# Patient Record
Sex: Male | Born: 1935 | Race: Black or African American | Hispanic: No | Marital: Married | Smoking: Former smoker
Health system: Southern US, Community
[De-identification: ages and names within clinical notes are randomized; demographics above are authoritative.]

## PROBLEM LIST (undated history)

## (undated) DIAGNOSIS — N183 Chronic kidney disease, stage 3 unspecified: Secondary | ICD-10-CM

## (undated) DIAGNOSIS — E119 Type 2 diabetes mellitus without complications: Secondary | ICD-10-CM

## (undated) DIAGNOSIS — H269 Unspecified cataract: Secondary | ICD-10-CM

## (undated) DIAGNOSIS — Z8673 Personal history of transient ischemic attack (TIA), and cerebral infarction without residual deficits: Secondary | ICD-10-CM

## (undated) DIAGNOSIS — I10 Essential (primary) hypertension: Secondary | ICD-10-CM

## (undated) HISTORY — PX: ORCHIECTOMY: SHX2116

## (undated) HISTORY — PX: SURGERY OF LIP: SUR1315

---

## 2006-10-26 ENCOUNTER — Ambulatory Visit (HOSPITAL_COMMUNITY): Admission: RE | Admit: 2006-10-26 | Discharge: 2006-10-26 | Payer: Self-pay | Admitting: Podiatry

## 2006-10-29 ENCOUNTER — Ambulatory Visit: Payer: Self-pay | Admitting: Family Medicine

## 2006-11-19 ENCOUNTER — Ambulatory Visit: Payer: Self-pay | Admitting: Family Medicine

## 2007-05-28 IMAGING — US US EXTREM LOW VENOUS BILAT
1 series · 14 of 24 positions shown · non-contrast
Comparison: none

CLINICAL DATA: Bilateral lower extremity redness with ulcers.    
 BILATERAL LOWER EXTREMITY VENOUS DOPPLER ULTRASOUND:
TECHNIQUE: Gray-scale sonography with compression, as well as color and duplex Doppler ultrasound, were performed to evaluate the deep venous system from the level of the common femoral vein through the popliteal and proximal calf veins.

[Series 1: unknown · 14 of 53 slices shown]
[im 1/53]
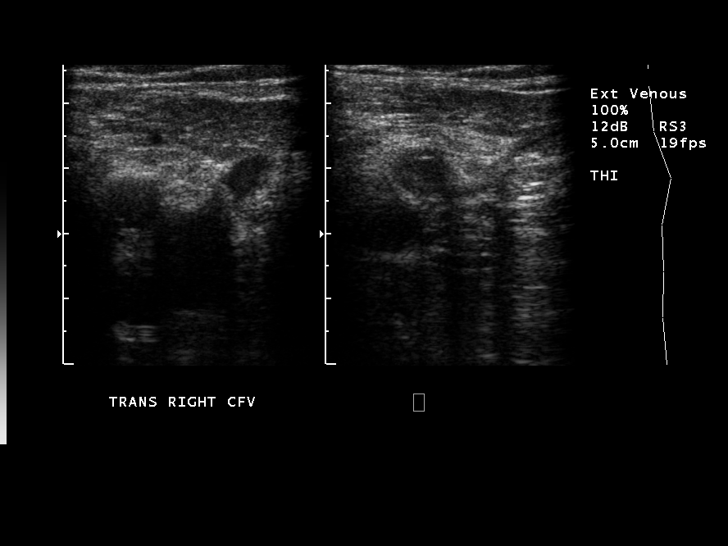
[im 5/53]
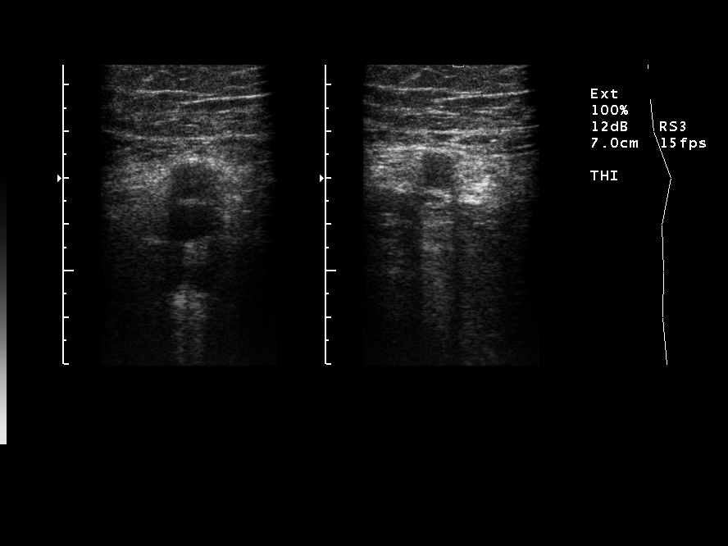
[im 10/53]
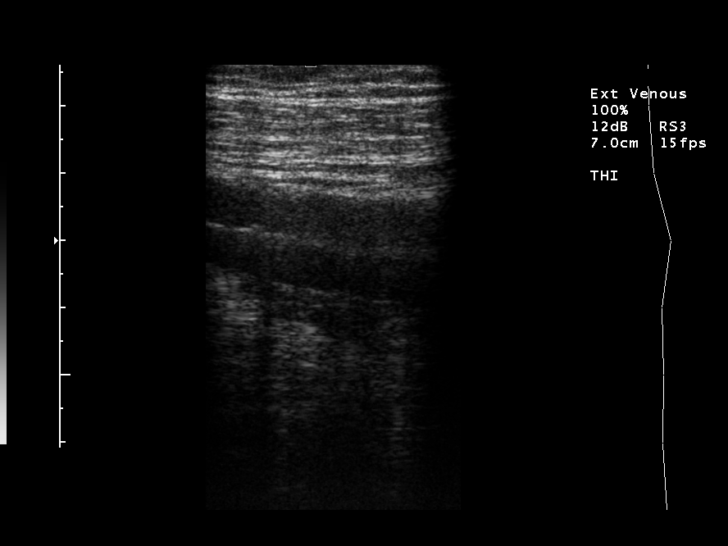
[im 14/53]
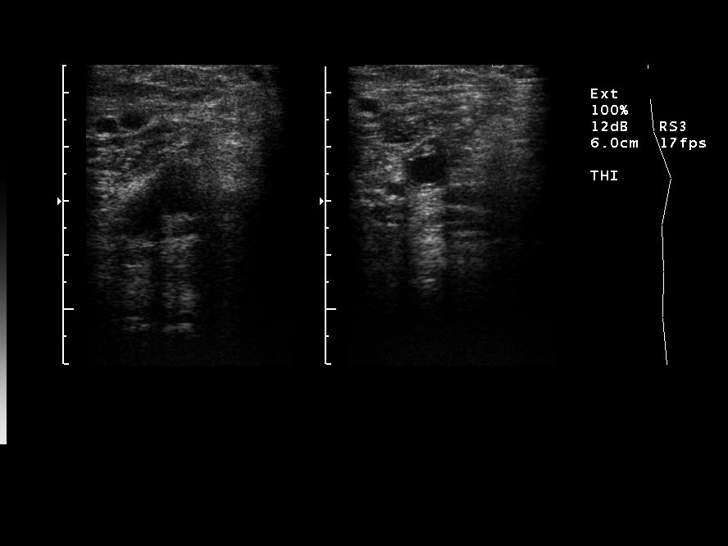
[im 16/53]
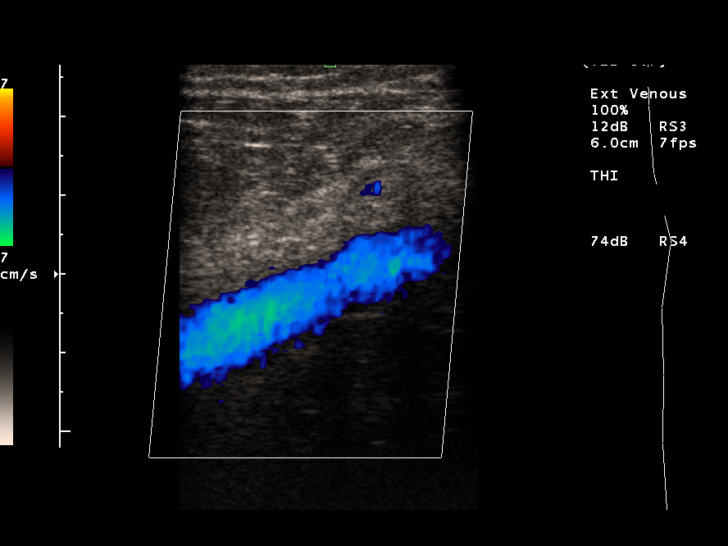
[im 21/53]
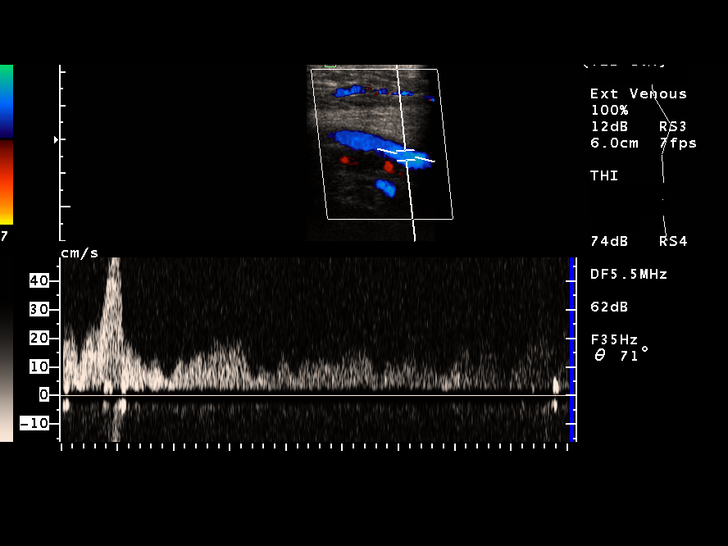
[im 25/53]
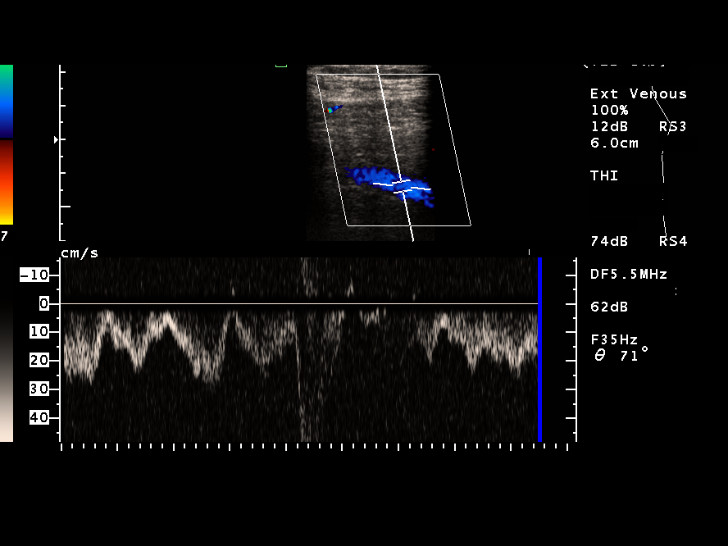
[im 28/53]
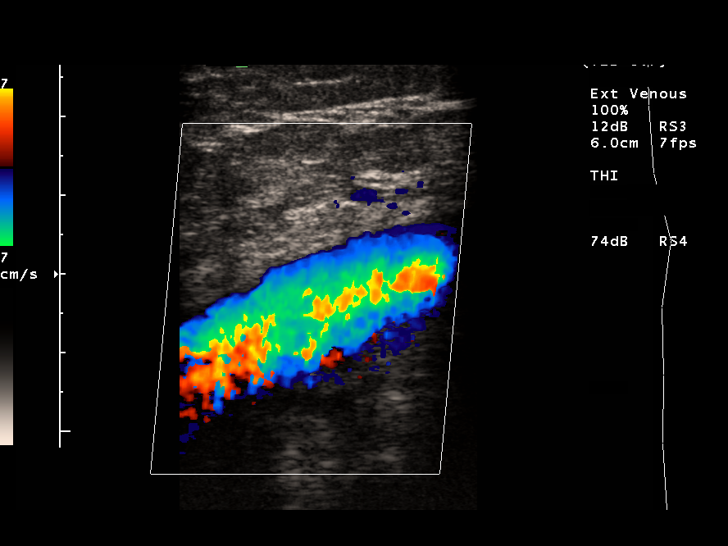
[im 32/53]
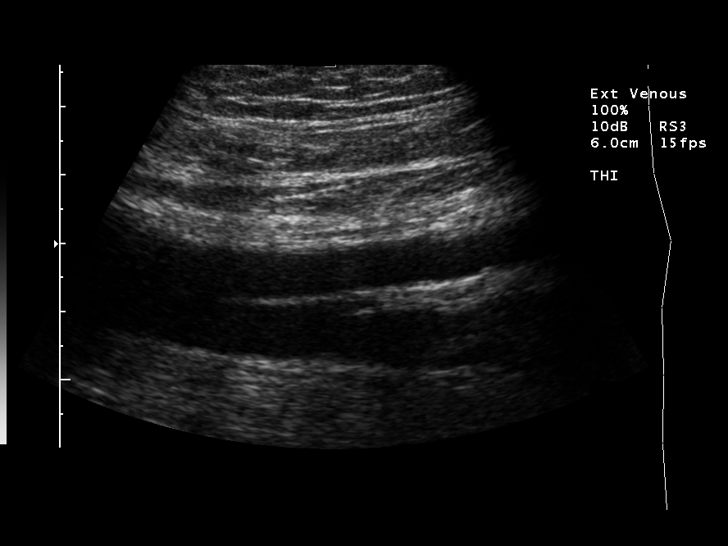
[im 37/53]
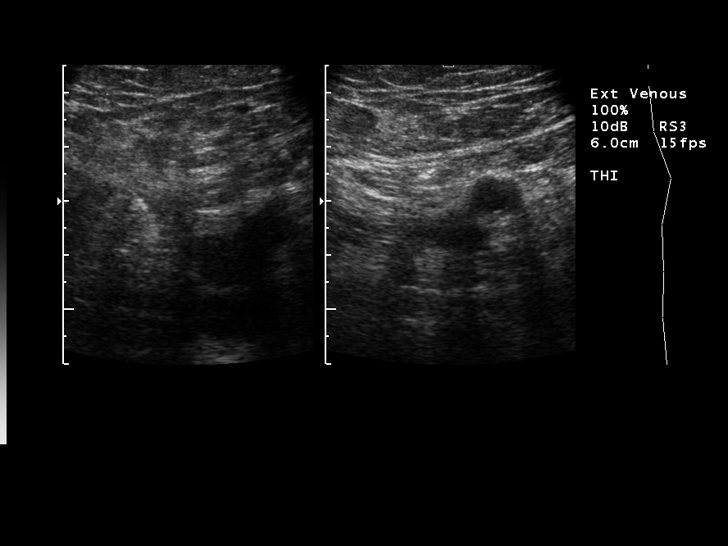
[im 41/53]
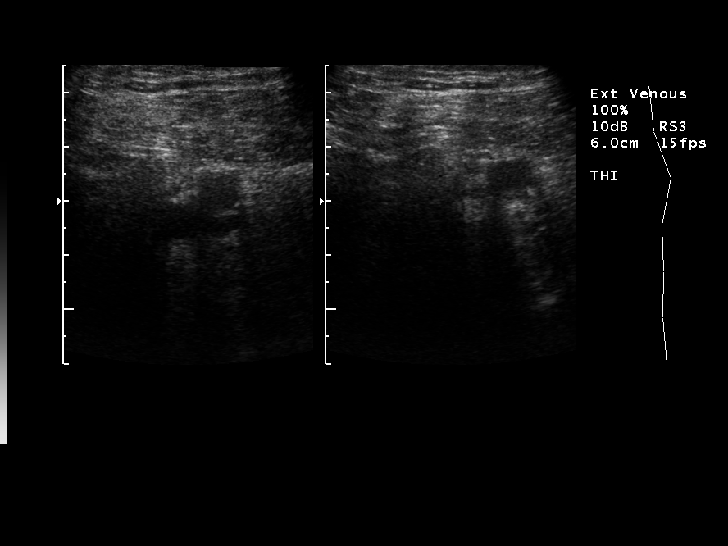
[im 43/53]
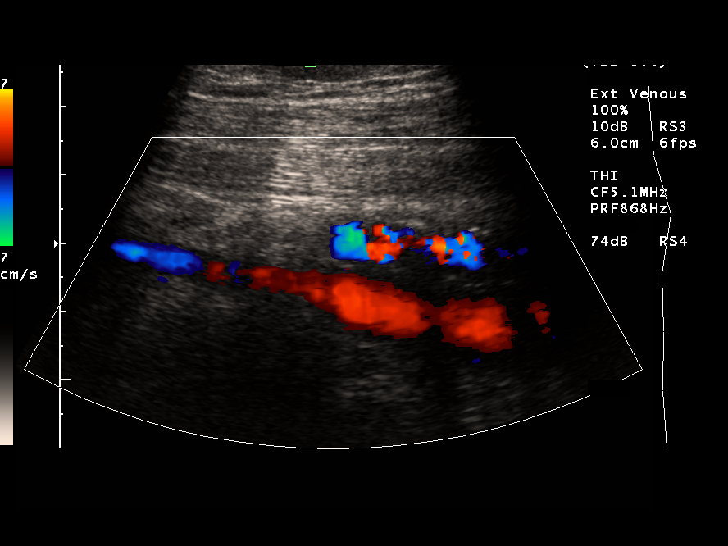
[im 48/53]
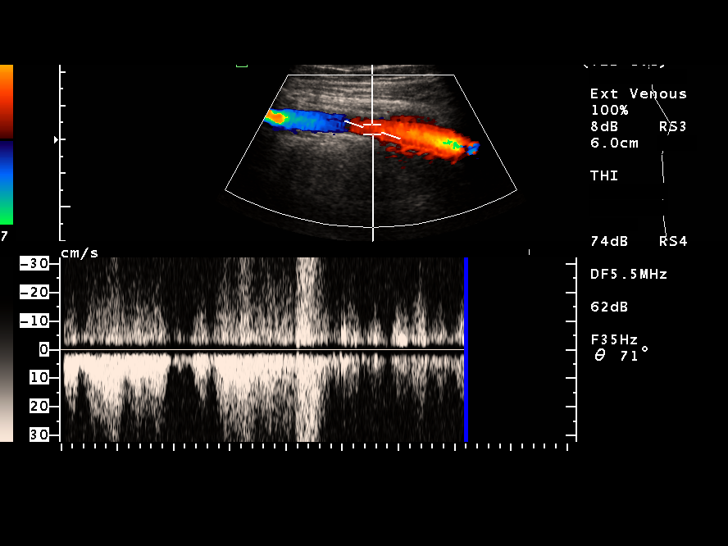
[im 53/53]
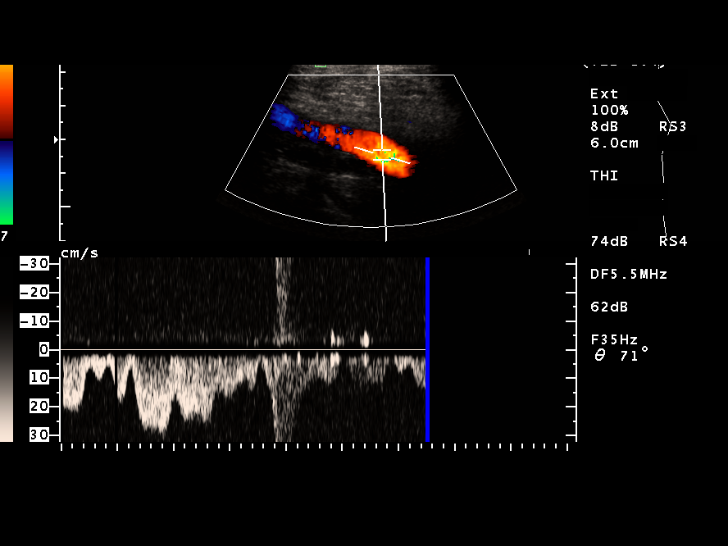

[14 of 24 positions shown; findings below may reference images not displayed]

FINDINGS: No evidence of deep venous thrombosis on either side from the level of the common femoral vein to the upper posterior tibial vein.  Enlarged lymph nodes left groin.  Etiology or significance indeterminate.
IMPRESSION: 1.  No evidence of deep venous thrombosis on either side.  
 2.  Left inguinal adenopathy.  Etiology indeterminate.

## 2009-06-19 DIAGNOSIS — Z8673 Personal history of transient ischemic attack (TIA), and cerebral infarction without residual deficits: Secondary | ICD-10-CM

## 2009-06-19 HISTORY — DX: Personal history of transient ischemic attack (TIA), and cerebral infarction without residual deficits: Z86.73

## 2009-11-17 DEATH — deceased

## 2013-03-19 ENCOUNTER — Ambulatory Visit: Payer: Medicare Other

## 2013-03-19 DIAGNOSIS — Z23 Encounter for immunization: Secondary | ICD-10-CM

## 2013-03-31 ENCOUNTER — Encounter (HOSPITAL_COMMUNITY): Payer: Self-pay | Admitting: Pharmacy Technician

## 2013-04-08 ENCOUNTER — Emergency Department (HOSPITAL_COMMUNITY)
Admission: EM | Admit: 2013-04-08 | Discharge: 2013-04-08 | Disposition: A | Discharge status: Home / Self Care | Payer: Medicare Other | Attending: Emergency Medicine | Admitting: Emergency Medicine

## 2013-04-08 ENCOUNTER — Other Ambulatory Visit: Payer: Self-pay

## 2013-04-08 ENCOUNTER — Encounter (HOSPITAL_COMMUNITY): Payer: Self-pay | Admitting: Emergency Medicine

## 2013-04-08 ENCOUNTER — Encounter (HOSPITAL_COMMUNITY)
Admission: RE | Admit: 2013-04-08 | Discharge: 2013-04-08 | Disposition: A | Discharge status: Home / Self Care | Payer: Medicare Other | Source: Ambulatory Visit | Attending: Ophthalmology | Admitting: Ophthalmology

## 2013-04-08 DIAGNOSIS — I10 Essential (primary) hypertension: Secondary | ICD-10-CM

## 2013-04-08 DIAGNOSIS — Z8669 Personal history of other diseases of the nervous system and sense organs: Secondary | ICD-10-CM | POA: Insufficient documentation

## 2013-04-08 DIAGNOSIS — Z87891 Personal history of nicotine dependence: Secondary | ICD-10-CM | POA: Insufficient documentation

## 2013-04-08 HISTORY — DX: Unspecified cataract: H26.9

## 2013-04-08 MED ORDER — HYDROCHLOROTHIAZIDE 25 MG PO TABS
25.0000 mg | ORAL_TABLET | Freq: Once | ORAL | Status: AC
  Administered 2013-04-08: 25 mg via ORAL
  Filled 2013-04-08: qty 1

## 2013-04-08 MED ORDER — HYDROCHLOROTHIAZIDE 25 MG PO TABS: 25.0000 mg | ORAL_TABLET | Freq: Every day | ORAL | Status: DC

## 2013-04-08 NOTE — ED Notes (Signed)
Pharmacy called for med order. Pharmacy to send to ED.

## 2013-04-08 NOTE — ED Notes (Signed)
Patient with no complaints at this time. Respirations even and unlabored. Skin warm/dry. Discharge instructions reviewed with patient at this time. Patient given opportunity to voice concerns/ask questions. Patient discharged at this time and left Emergency Department with steady gait.   

## 2013-04-08 NOTE — ED Provider Notes (Signed)
This chart was scribed for Curtis Maw Ward, DO by Caryn Bee, ED Scribe. This patient was seen in room APA17/AP[facility]17.  TIME SEEN: 10:35  CHIEF COMPLAINT: HTN  HPI: Pt is 79 y.o. with h/o HTN presents to the ED with elevated blood pressure. Reports that he was being seen for preop for cataract surgery and they noted his blood pressure was elevated and sent him to the ED. He states that he "feels good". Pt states that he has been off of his HTN medication for about 3 years. He reports that he needs surgery on his eyes, but was told he cannot get the operation until his HTN is controlled.  Pt denies HA, blurry vision, chest pain, SOB, numbness, tingling, weakness. Pt does not currently have PCP.  ROS: See HPI Constitutional: no fever  Eyes: no drainage, no blurred vision  ENT: no runny nose   Cardiovascular:  no chest pain  Resp: no SOB  GI: no vomiting GU: no dysuria Integumentary: no rash  Allergy: no hives  Musculoskeletal: no leg swelling  Neurological: no slurred speech, no numbness, no weakness, no tingling sensation ROS otherwise negative  PAST MEDICAL HISTORY/PAST SURGICAL HISTORY:  Past Medical History  Diagnosis Date  . Hypertension   . Cataract     MEDICATIONS:  Prior to Admission medications   Not on File    ALLERGIES:  No Known Allergies  SOCIAL HISTORY:  History  Substance Use Topics  . Smoking status: Former Games developer  . Smokeless tobacco: Not on file  . Alcohol Use: No    FAMILY HISTORY: History reviewed. No pertinent family history.  EXAM: Triage Vitals: BP 209/90  Pulse 91  Temp(Src) 98.1 F (36.7 C)  Resp 20  Ht 6\' 2"  (1.88 m)  Wt 220 lb (99.791 kg)  BMI 28.23 kg/m2 CONSTITUTIONAL: Alert and oriented and responds appropriately to questions. Well-appearing; well-nourished HEAD: Normocephalic EYES: Conjunctivae clear, PERRL ENT: normal nose; no rhinorrhea; moist mucous membranes; pharynx without lesions noted NECK: Supple, no meningismus, no  LAD  CARD: RRR; S1 and S2 appreciated; no murmurs, no clicks, no rubs, no gallops RESP: Normal chest excursion without splinting or tachypnea; breath sounds clear and equal bilaterally; no wheezes, no rhonchi, no rales,  ABD/GI: Normal bowel sounds; non-distended; soft, non-tender, no rebound, no guarding BACK:  The back appears normal and is non-tender to palpation, there is no CVA tenderness EXT: Normal ROM in all joints; non-tender to palpation; no edema; normal capillary refill; no cyanosis    SKIN: Normal color for age and race; warm NEURO: Moves all extremities equally, 5/5 strength in all 4 extremities, sensation to light touch intact diffusely, no dysmetria finger to nose test nerves II through XII intact PSYCH: The patient's mood and manner are appropriate. Grooming and personal hygiene are appropriate.  MEDICAL DECISION MAKING: Patient here with asymptomatic hypertension. Will restart his hydrochlorothiazide. I do not feel he needs any workup at this time. Have discussed at length the patient needs to followup with a primary care physician. Given strict return precautions. Patient verbalizes understanding and is comfortable with this plan.   Date: 04/08/2013 9:16 AM  Rate: 92  Rhythm: normal sinus rhythm  QRS Axis: normal  Intervals: normal  ST/T Wave abnormalities: normal  Conduction Disutrbances: none  Narrative Interpretation: unremarkable; no ST changes; no ectopy         Curtis Maw Ward, DO 04/08/13 1611

## 2013-04-08 NOTE — ED Notes (Signed)
Pt here for cataract surgery f/u. bp taken and sent to ED. Pt denies dizziness/chest pain/pressure/tightness or sob. Pt states "i feel good". Alert/oreinted. Nad.

## 2013-04-08 NOTE — ED Notes (Signed)
Pt states he took himself off of his blood pressure medication 5-6 years ago. Pt states he feels fine. No complaints at this time.

## 2013-04-08 NOTE — ED Notes (Signed)
Denies trouble swallowing/speaking

## 2013-04-14 ENCOUNTER — Ambulatory Visit (HOSPITAL_COMMUNITY): Admission: RE | Admit: 2013-04-14 | Payer: Medicare Other | Source: Ambulatory Visit | Admitting: Ophthalmology

## 2013-04-14 ENCOUNTER — Encounter (HOSPITAL_COMMUNITY): Admission: RE | Payer: Self-pay | Source: Ambulatory Visit

## 2013-04-14 SURGERY — PHACOEMULSIFICATION, CATARACT, WITH IOL INSERTION
Anesthesia: Monitor Anesthesia Care | Site: Eye | Laterality: Left

## 2013-04-15 ENCOUNTER — Encounter: Payer: Self-pay | Admitting: General Practice

## 2013-04-15 ENCOUNTER — Ambulatory Visit (INDEPENDENT_AMBULATORY_CARE_PROVIDER_SITE_OTHER): Payer: Medicare Other | Admitting: General Practice

## 2013-04-15 VITALS — BP 176/92 | HR 108 | Temp 97.8°F | Ht 74.0 in | Wt 206.5 lb

## 2013-04-15 DIAGNOSIS — I10 Essential (primary) hypertension: Secondary | ICD-10-CM

## 2013-04-15 MED ORDER — HYDROCHLOROTHIAZIDE 25 MG PO TABS: 25.0000 mg | ORAL_TABLET | Freq: Every day | ORAL | Status: DC

## 2013-04-15 MED ORDER — LISINOPRIL 20 MG PO TABS: 20.0000 mg | ORAL_TABLET | Freq: Every day | ORAL | Status: DC

## 2013-04-15 NOTE — Progress Notes (Signed)
  Subjective:    Patient ID: Curtis Chan, male    DOB: 06-Nov-1925, 79 y.o.   MRN: [id number]  HPI Patient presents today for blood pressure follow up. He reports being scheduled for cataract surgery, but unable to have procedure due to elevated blood pressure. He reports taking medication in the past, but discontinued on his own. He reports eating a regular diet. He reports a history of diabetes, but refuses antidiabetic medications.     Review of Systems  Constitutional: Negative for fever and chills.  Respiratory: Positive for shortness of breath. Negative for chest tightness.        Sob with exertion   Cardiovascular: Negative for chest pain and palpitations.  Neurological: Negative for dizziness, weakness and headaches.       Objective:   Physical Exam  Constitutional: He is oriented to person, place, and time. He appears well-developed and well-nourished.  Neck: Normal range of motion. Neck supple. No thyromegaly present.  Cardiovascular: Normal rate and normal heart sounds.   Pulmonary/Chest: Effort normal and breath sounds normal. No respiratory distress. He exhibits no tenderness.  Musculoskeletal: Normal range of motion.  Lymphadenopathy:    He has no cervical adenopathy.  Neurological: He is alert and oriented to person, place, and time.  Skin: Skin is warm and dry.  Psychiatric: He has a normal mood and affect.          Assessment & Plan:  1. Hypertension  - lisinopril (PRINIVIL,ZESTRIL) 20 MG tablet; Take 1 tablet (20 mg total) by mouth daily.  Dispense: 30 tablet; Refill: 0 - hydrochlorothiazide (HYDRODIURIL) 25 MG tablet; Take 1 tablet (25 mg total) by mouth daily.  Dispense: 30 tablet; Refill: 3 -discussed low sodium diet -RTO in 1 week for blood pressure recheck -Patient verbalized understanding -Coralie Keens, FNP-C

## 2013-04-15 NOTE — Patient Instructions (Signed)

## 2013-04-22 ENCOUNTER — Ambulatory Visit (INDEPENDENT_AMBULATORY_CARE_PROVIDER_SITE_OTHER): Payer: Medicare Other | Admitting: General Practice

## 2013-04-22 ENCOUNTER — Encounter: Payer: Self-pay | Admitting: General Practice

## 2013-04-22 VITALS — BP 154/77 | HR 101 | Temp 98.1°F | Wt 201.5 lb

## 2013-04-22 DIAGNOSIS — T07XXXA Unspecified multiple injuries, initial encounter: Secondary | ICD-10-CM

## 2013-04-22 DIAGNOSIS — L089 Local infection of the skin and subcutaneous tissue, unspecified: Secondary | ICD-10-CM

## 2013-04-22 DIAGNOSIS — I10 Essential (primary) hypertension: Secondary | ICD-10-CM

## 2013-04-22 DIAGNOSIS — T148XXD Other injury of unspecified body region, subsequent encounter: Secondary | ICD-10-CM

## 2013-04-22 MED ORDER — LISINOPRIL 20 MG PO TABS: 20.0000 mg | ORAL_TABLET | Freq: Every day | ORAL | Status: DC

## 2013-04-22 MED ORDER — CEPHALEXIN 500 MG PO CAPS: 500.0000 mg | ORAL_CAPSULE | Freq: Two times a day (BID) | ORAL | Status: DC

## 2013-04-22 NOTE — Progress Notes (Signed)
  Subjective:    Patient ID: Curtis Chan, male    DOB: 1926/02/26, 79 y.o.   MRN: [id number]  HPI Patient presents today for blood pressure for recheck. He reports taking medications as directed (HCTZ 25mg  and Lisinopril 20mg ) and eating low sodium diet. He also complains of ingrown toenail on right great toe and 2nd toenail.    Review of Systems  Constitutional: Negative for fever and chills.  Respiratory: Negative for chest tightness and shortness of breath.   Cardiovascular: Negative for chest pain and palpitations.  Genitourinary: Negative for difficulty urinating.  Neurological: Negative for dizziness, weakness and headaches.       Objective:   Physical Exam  Constitutional: He is oriented to person, place, and time. He appears well-developed and well-nourished.  Cardiovascular: Normal rate, regular rhythm and normal heart sounds.   Pulmonary/Chest: Effort normal and breath sounds normal. No respiratory distress. He exhibits no tenderness.  Neurological: He is alert and oriented to person, place, and time.  Skin: Skin is dry.  Right great toe, non healing wound (due to clipping nails too short), measures .5 inch x 1/4 inch. Second toe right foot discolored (reddish, purple), with tip of toe scabbed due to toenail clipping.   Psychiatric: He has a normal mood and affect.          Assessment & Plan:  1. Hypertension  - lisinopril (PRINIVIL,ZESTRIL) 20 MG tablet; Take 1 tablet (20 mg total) by mouth daily.  Dispense: 30 tablet; Refill: 3  2. Wound healing, delayed  - Ambulatory referral to Vascular Surgery  3. Skin infection  - cephALEXin (KEFLEX) 500 MG capsule; Take 1 capsule (500 mg total) by mouth 2 (two) times daily.  Dispense: 20 capsule; Refill: 0 -keep right foot affected area clean and dry -RTO if symptoms  -Patient verbalized understanding -Coralie Keens, FNP-C

## 2013-04-22 NOTE — Patient Instructions (Addendum)
Wound Infection  A wound infection happens when a type of germ (bacteria) starts growing in the wound. In some cases, this can cause the wound to break open. If cared for properly, the infected wound will heal from the inside to the outside. Wound infections need treatment.  CAUSES  An infection is caused by bacteria growing in the wound.   SYMPTOMS    Increase in redness, swelling, or pain at the wound site.   Increase in drainage at the wound site.   Wound or bandage (dressing) starts to smell bad.   Fever.   Feeling tired or fatigued.   Pus draining from the wound.  TREATMENT   You caregiver will prescribe antibiotic medicine. The wound infection should improve within 24 to 48 hours. Any redness around the wound should stop spreading and the wound should be less painful.   HOME CARE INSTRUCTIONS    Only take over-the-counter or prescription medicines for pain, discomfort, or fever as directed by your caregiver.   Take your antibiotics as directed. Finish them even if you start to feel better.   Gently wash the area with mild soap and water 2 times a day, or as directed. Rinse off the soap. Pat the area dry with a clean towel. Do not rub the wound. This may cause bleeding.   Follow your caregiver's instructions for how often you need to change the dressing.   Apply ointment and a dressing to the wound as directed.   If the dressing sticks, moisten it with soapy water and gently remove it.   Change the bandage right away if it becomes wet, dirty, or develops a bad smell.   Take showers. Do not take tub baths, swim, or do anything that may soak the wound until it is healed.   Avoid exercises that make you sweat heavily.   Use anti-itch medicine as directed by your caregiver. The wound may itch when it is healing. Do not pick or scratch at the wound.   Follow up with your caregiver to get your wound rechecked as directed.  SEEK MEDICAL CARE IF:   You have an increase in swelling, pain, or redness  around the wound.   You have an increase in the amount of pus coming from the wound.   There is a bad smell coming from the wound.   More of the wound breaks open.   You have a fever.  MAKE SURE YOU:    Understand these instructions.   Will watch your condition.   Will get help right away if you are not doing well or get worse.  Document Released: 03/04/2003 Document Revised: 08/28/2011 Document Reviewed: 10/09/2010  [employer] Patient Information 2014 [employer].

## 2013-04-23 ENCOUNTER — Other Ambulatory Visit: Payer: Self-pay | Admitting: *Deleted

## 2013-04-23 DIAGNOSIS — L97909 Non-pressure chronic ulcer of unspecified part of unspecified lower leg with unspecified severity: Secondary | ICD-10-CM

## 2013-04-23 NOTE — Patient Instructions (Signed)
Your procedure is scheduled on:  04/28/2013  Report to [facility] at    1000  AM.  Call this number if you have problems the morning of surgery: [phone]   Remember:   Do not eat or drink :After Midnight.    Take these medicines the morning of surgery with A SIP OF WATER: lisinopril   Do not wear jewelry, make-up or nail polish.  Do not wear lotions, powders, or perfumes. You may wear deodorant.  Do not shave 48 hours prior to surgery.  Do not bring valuables to the hospital.  Contacts, dentures or bridgework may not be worn into surgery.  Patients discharged the day of surgery will not be allowed to drive home.  Name and phone number of your driver:    Please read over the following fact sheets that you were given: Pain Booklet, Surgical Site Infection Prevention, Anesthesia Post-op Instructions and Care and Recovery After Surgery  Cataract Surgery  A cataract is a clouding of the lens of the eye. When a lens becomes cloudy, vision is reduced based on the degree and nature of the clouding. Surgery may be needed to improve vision. Surgery removes the cloudy lens and usually replaces it with a substitute lens (intraocular lens, IOL). LET YOUR EYE DOCTOR KNOW ABOUT:  Allergies to food or medicine.   Medicines taken including herbs, eyedrops, over-the-counter medicines, and creams.   Use of steroids (by mouth or creams).   Previous problems with anesthetics or numbing medicine.   History of bleeding problems or blood clots.   Previous surgery.   Other health problems, including diabetes and kidney problems.   Possibility of pregnancy, if this applies.  RISKS AND COMPLICATIONS  Infection.   Inflammation of the eyeball (endophthalmitis) that can spread to both eyes (sympathetic ophthalmia).   Poor wound healing.   If an IOL is inserted, it can later fall out of proper position. This is very uncommon.   Clouding of the part of your eye that holds an IOL in place. This  is called an "after-cataract." These are uncommon, but easily treated.  BEFORE THE PROCEDURE  Do not eat or drink anything except small amounts of water for 8 to 12 before your surgery, or as directed by your caregiver.   Unless you are told otherwise, continue any eyedrops you have been prescribed.   Talk to your primary caregiver about all other medicines that you take (both prescription and non-prescription). In some cases, you may need to stop or change medicines near the time of your surgery. This is most important if you are taking blood-thinning medicine.Do not stop medicines unless you are told to do so.   Arrange for someone to drive you to and from the procedure.   Do not put contact lenses in either eye on the day of your surgery.  PROCEDURE There is more than one method for safely removing a cataract. Your doctor can explain the differences and help determine which is best for you. Phacoemulsification surgery is the most common form of cataract surgery.  An injection is given behind the eye or eyedrops are given to make this a painless procedure.   A small cut (incision) is made on the edge of the clear, dome-shaped surface that covers the front of the eye (cornea).   A tiny probe is painlessly inserted into the eye. This device gives off ultrasound waves that soften and break up the cloudy center of the lens. This makes it easier for  the cloudy lens to be removed by suction.   An IOL may be implanted.   The normal lens of the eye is covered by a clear capsule. Part of that capsule is intentionally left in the eye to support the IOL.   Your surgeon may or may not use stitches to close the incision.  There are other forms of cataract surgery that require a larger incision and stiches to close the eye. This approach is taken in cases where the doctor feels that the cataract cannot be easily removed using phacoemulsification. AFTER THE PROCEDURE  When an IOL is implanted, it  does not need care. It becomes a permanent part of your eye and cannot be seen or felt.   Your doctor will schedule follow-up exams to check on your progress.   Review your other medicines with your doctor to see which can be resumed after surgery.   Use eyedrops or take medicine as prescribed by your doctor.  Document Released: 05/25/2011 Document Reviewed: 05/22/2011 [facility] Patient Information 2012 [place].  .Cataract Surgery Care After Refer to this sheet in the next few weeks. These instructions provide you with information on caring for yourself after your procedure. Your caregiver may also give you more specific instructions. Your treatment has been planned according to current medical practices, but problems sometimes occur. Call your caregiver if you have any problems or questions after your procedure.  HOME CARE INSTRUCTIONS   Avoid strenuous activities as directed by your caregiver.   Ask your caregiver when you can resume driving.   Use eyedrops or other medicines to help healing and control pressure inside your eye as directed by your caregiver.   Only take over-the-counter or prescription medicines for pain, discomfort, or fever as directed by your caregiver.   Do not to touch or rub your eyes.   You may be instructed to use a protective shield during the first few days and nights after surgery. If not, wear sunglasses to protect your eyes. This is to protect the eye from pressure or from being accidentally bumped.   Keep the area around your eye clean and dry. Avoid swimming or allowing water to hit you directly in the face while showering. Keep soap and shampoo out of your eyes.   Do not bend or lift heavy objects. Bending increases pressure in the eye. You can walk, climb stairs, and do light household chores.   Do not put a contact lens into the eye that had surgery until your caregiver says it is okay to do so.   Ask your doctor when you can return to  work. This will depend on the kind of work that you do. If you work in a dusty environment, you may be advised to wear protective eyewear for a period of time.   Ask your caregiver when it will be safe to engage in sexual activity.   Continue with your regular eye exams as directed by your caregiver.  What to expect:  It is normal to feel itching and mild discomfort for a few days after cataract surgery. Some fluid discharge is also common, and your eye may be sensitive to light and touch.   After 1 to 2 days, even moderate discomfort should disappear. In most cases, healing will take about 6 weeks.   If you received an intraocular lens (IOL), you may notice that colors are very bright or have a blue tinge. Also, if you have been in bright sunlight, everything may appear  reddish for a few hours. If you see these color tinges, it is because your lens is clear and no longer cloudy. Within a few months after receiving an IOL, these extra colors should go away. When you have healed, you will probably need new glasses.  SEEK MEDICAL CARE IF:   You have increased bruising around your eye.   You have discomfort not helped by medicine.  SEEK IMMEDIATE MEDICAL CARE IF:   You have a fever.   You have a worsening or sudden vision loss.   You have redness, swelling, or increasing pain in the eye.   You have a thick discharge from the eye that had surgery.  MAKE SURE YOU:  Understand these instructions.   Will watch your condition.   Will get help right away if you are not doing well or get worse.  Document Released: 12/23/2004 Document Revised: 05/25/2011 Document Reviewed: 01/27/2011 [facility] Patient Information 2012 [place].

## 2013-04-24 ENCOUNTER — Encounter: Payer: Self-pay | Admitting: Vascular Surgery

## 2013-04-24 ENCOUNTER — Encounter (HOSPITAL_COMMUNITY)
Admission: RE | Admit: 2013-04-24 | Discharge: 2013-04-24 | Disposition: A | Discharge status: Home / Self Care | Payer: Medicare Other | Source: Ambulatory Visit | Attending: Ophthalmology | Admitting: Ophthalmology

## 2013-04-24 ENCOUNTER — Encounter (HOSPITAL_COMMUNITY): Payer: Self-pay

## 2013-04-24 LAB — BASIC METABOLIC PANEL
BUN: 41 mg/dL — ABNORMAL HIGH (ref 6–23)
Chloride: 94 mEq/L — ABNORMAL LOW (ref 96–112)
Creatinine, Ser: 2.44 mg/dL — ABNORMAL HIGH (ref 0.50–1.35)
GFR calc Af Amer: 26 mL/min — ABNORMAL LOW (ref >90)
GFR calc non Af Amer: 23 mL/min — ABNORMAL LOW (ref >90)
Potassium: 3.9 mEq/L (ref 3.5–5.1)

## 2013-04-24 LAB — HEMOGLOBIN AND HEMATOCRIT, BLOOD
HCT: 38.7 % — ABNORMAL LOW (ref 39.0–52.0)
Hemoglobin: 13.9 g/dL (ref 13.0–17.0)

## 2013-04-25 ENCOUNTER — Ambulatory Visit (INDEPENDENT_AMBULATORY_CARE_PROVIDER_SITE_OTHER): Payer: Medicare Other | Admitting: Vascular Surgery

## 2013-04-25 ENCOUNTER — Encounter: Payer: Self-pay | Admitting: Vascular Surgery

## 2013-04-25 ENCOUNTER — Ambulatory Visit (HOSPITAL_COMMUNITY)
Admission: RE | Admit: 2013-04-25 | Discharge: 2013-04-25 | Disposition: A | Discharge status: Home / Self Care | Payer: Medicare Other | Source: Ambulatory Visit | Attending: Vascular Surgery | Admitting: Vascular Surgery

## 2013-04-25 VITALS — BP 139/73 | HR 90 | Resp 16 | Ht 74.0 in | Wt 202.0 lb

## 2013-04-25 DIAGNOSIS — I7025 Atherosclerosis of native arteries of other extremities with ulceration: Secondary | ICD-10-CM | POA: Insufficient documentation

## 2013-04-25 DIAGNOSIS — I739 Peripheral vascular disease, unspecified: Secondary | ICD-10-CM

## 2013-04-25 DIAGNOSIS — L97909 Non-pressure chronic ulcer of unspecified part of unspecified lower leg with unspecified severity: Secondary | ICD-10-CM | POA: Insufficient documentation

## 2013-04-25 NOTE — Progress Notes (Signed)
[facility] [place]  Referred by:  Ernestina Penna, MD [address] [place], [place] [address]  Reason for referral: non-healing R great toe  History of Present Illness  Curtis Chan is a 79 y.o. (1926/03/09) male who presents with chief complaint: poorly healing R 1st and 2nd toes.  Onset of symptom occurred after clipping both toes.  The patient has not been able to heal the toe tips for the last 2-3 weeks.  The patient claims minimal pain.  Patient has attempted to treat this pain with first aid.  The patient has no rest pain symptoms also.  The patient notes some serosang ooze from the great toe.  He notes a >20 year history of DM.  Atherosclerotic risk factors include: HTN, DM, and h/o smoking.  Past Medical History  Diagnosis Date  . Hypertension   . Cataract   . Diabetes mellitus without complication   . Stroke 2011    mini stroke    Past Surgical History  Procedure Laterality Date  . Surgery of lip      History   Social History  . Marital Status: Married    Spouse Name: N/A    Number of Children: N/A  . Years of Education: N/A   Occupational History  . Not on file.   Social History Main Topics  . Smoking status: Former Smoker -- 0.25 packs/day for 35 years    Types: Cigarettes    Quit date: 04/25/1983  . Smokeless tobacco: Never Used  . Alcohol Use: No  . Drug Use: No  . Sexual Activity: Yes    Birth Control/ Protection: None   Other Topics Concern  . Not on file   Social History Narrative  . No narrative on file    Family History  Problem Relation Age of Onset  . Diabetes Sister   . Diabetes Sister   . Diabetes Brother   . Diabetes Brother   . Diabetes Brother   . Diabetes Brother   . Cancer Father     Current Outpatient Prescriptions on File Prior to Visit  Medication Sig Dispense Refill  . cephALEXin (KEFLEX) 500 MG capsule Take 1 capsule (500 mg total) by mouth 2 (two) times daily.  20 capsule  0  .  hydrochlorothiazide (HYDRODIURIL) 25 MG tablet Take 1 tablet (25 mg total) by mouth daily.  30 tablet  3  . lisinopril (PRINIVIL,ZESTRIL) 20 MG tablet Take 1 tablet (20 mg total) by mouth daily.  30 tablet  3   No current facility-administered medications on file prior to visit.    No Known Allergies   REVIEW OF SYSTEMS:  (Positives checked otherwise negative)  CARDIOVASCULAR:  []  chest pain, []  chest pressure, []  palpitations, []  shortness of breath when laying flat, []  shortness of breath with exertion,  []  pain in feet when walking, []  pain in feet when laying flat, []  history of blood clot in veins (DVT), []  history of phlebitis, []  swelling in legs, []  varicose veins  PULMONARY:  []  productive cough, []  asthma, []  wheezing  NEUROLOGIC:  []  weakness in arms or legs, []  numbness in arms or legs, []  difficulty speaking or slurred speech, []  temporary loss of vision in one eye, []  dizziness  HEMATOLOGIC:  []  bleeding problems, []  problems with blood clotting too easily  MUSCULOSKEL:  []  joint pain, []  joint swelling  GASTROINTEST:  []  vomiting blood, []  blood in stool     GENITOURINARY:  []  burning with  urination, []  blood in urine  PSYCHIATRIC:  []  history of major depression  INTEGUMENTARY:  []  rashes, [x]  ulcers  CONSTITUTIONAL:  []  fever, []  chills  For VQI Use Only  PRE-ADM LIVING: Home  AMB STATUS: Ambulatory  CAD Sx: None  PRIOR CHF: None  STRESS TEST: [x]  No, [ ]  Normal, [ ]  + ischemia, [ ]  + MI, [ ]  Both  Physical Examination  Filed Vitals:   04/25/13 1246  BP: 139/73  Pulse: 90  Resp: 16  Height: 6\' 2"  (1.88 m)  Weight: 202 lb (91.627 kg)  SpO2: 100%   Body mass index is 25.92 kg/(m^2).  General: A&O x 3, WD, elderly  Head: [place]/AT, Temporalis wasting,   Ear/Nose/Throat: Hearing grossly intact, nares w/o erythema or drainage, oropharynx w/o Erythema/Exudate, Mallampati score: 3  Eyes: PERRLA, EOMI  Neck: Supple, no nuchal rigidity, no palpable  LAD  Pulmonary: Sym exp, good air movt, CTAB, no rales, rhonchi, & wheezing  Cardiac: RRR, Nl S1, S2, no Murmurs, rubs or gallops  Vascular: Vessel Right Left  Radial Palpable Palpable  Brachial Palpable Palpable  Carotid Palpable, without bruit Palpable, without bruit  Aorta Not palpable N/A  Femoral Palpable Palpable  Popliteal Not palpable Faintly palpable  PT Not Palpable Not Palpable  DP Not Palpable Not Palpable   Gastrointestinal: soft, NTND, -G/R, - HSM, - masses, - CVAT B  Musculoskeletal: M/S 5/5 throughout , Extremities without ischemic changes except  R great toe: non-healing laceration just distal nail bed, R 2nd toe appears ischemic with non-healing nail bed  Neurologic: CN 2-12 intact , Pain and light touch intact in extremities , Motor exam as listed above  Psychiatric: Judgment intact, Mood & affect appropriate for pt's clinical situation  Dermatologic: See M/S exam for extremity exam, no rashes otherwise noted  Lymph : No Cervical, Axillary, or Inguinal lymphadenopathy   Non-Invasive Vascular Imaging  ABI (Date: 04/25/2013)  R: 0.61, DP: Mono, PT: Mono, TBI: 0.36  L: 0.46, DP: 0.46, PT: 0.41, TBI: 0.41  Medical Decision Making  Curtis Chan is a 79 y.o. male who presents with: BLE critical limb ischemia, non-healing R great toe wound, R 2nd toe ischemia   I discussed with the patient the natural history of critical limb ischemia: 25% require amputation in one year, 50% are able to maintain their limbs in one year, and 25-30% die in one year due to comorbidities.  Given the limb threatening status of this patient, I recommend an aggressive work up including proceeding with an: Aortogram, Bilateral runoff and intervention. I discussed with the patient the nature of angiographic procedures, especially the limited patencies of any endovascular intervention. The patient is aware of that the risks of an angiographic procedure include but are not limited to:  bleeding, infection, access site complications, embolization, rupture of treated vessel, dissection, possible need for emergent surgical intervention, and possible need for surgical procedures to treat the patient's pathology.  The patient is NOT INTERESTED in the procedure.  I discussed in depth with the patient the nature of atherosclerosis, and emphasized the importance of maximal medical management including strict control of blood pressure, blood glucose, and lipid levels, antiplatelet agents, obtaining regular exercise, and cessation of smoking.  The patient is aware that without maximal medical management the underlying atherosclerotic disease process will progress, limiting the benefit of any interventions. The patient is currently not on a statin.  He has no interest in taking such.  The patient is currently not on an anti-platelet.  He will start a 81 mg ASA daily.  I am referring him to a Wound Care clinic to try to get his R toes to heal, prior to needs a more extensive amputation.  I doubt he would heal a toe amputation with a TBI of only 0.36.  Thank you for allowing [place] to participate in this patient's care.  Leonides Sake, MD [facility] [place] Office: [phone] Pager: [phone]  04/25/2013, 1:34 PM

## 2013-04-25 NOTE — Pre-Procedure Instructions (Signed)
Dr Jayme Cloud aware of patients glucose of 420 and that patent is on no meds. Will check patient fasting am of surgery.

## 2013-04-25 NOTE — Pre-Procedure Instructions (Signed)
Spoke with Curtis Chan at Dr Jane Canary office. Dr Alto Denver is aware of glucose level and states patient is noncompliant to some extent. Curtis Chan has already contacted patient and told him that if glucose is too high that surgery will be postponed.

## 2013-04-28 ENCOUNTER — Encounter (HOSPITAL_COMMUNITY): Payer: Medicare Other | Admitting: Anesthesiology

## 2013-04-28 ENCOUNTER — Ambulatory Visit (HOSPITAL_COMMUNITY)
Admission: RE | Admit: 2013-04-28 | Discharge: 2013-04-28 | Disposition: A | Discharge status: Home / Self Care | Payer: Medicare Other | Source: Ambulatory Visit | Attending: Ophthalmology | Admitting: Ophthalmology

## 2013-04-28 ENCOUNTER — Encounter (HOSPITAL_COMMUNITY)
Admission: RE | Disposition: A | Discharge status: Home / Self Care | Payer: Self-pay | Source: Ambulatory Visit | Attending: Ophthalmology

## 2013-04-28 ENCOUNTER — Encounter (HOSPITAL_COMMUNITY): Payer: Self-pay | Admitting: *Deleted

## 2013-04-28 ENCOUNTER — Ambulatory Visit (HOSPITAL_COMMUNITY): Payer: Medicare Other | Admitting: Anesthesiology

## 2013-04-28 DIAGNOSIS — E119 Type 2 diabetes mellitus without complications: Secondary | ICD-10-CM | POA: Insufficient documentation

## 2013-04-28 DIAGNOSIS — Z01812 Encounter for preprocedural laboratory examination: Secondary | ICD-10-CM | POA: Insufficient documentation

## 2013-04-28 DIAGNOSIS — Z794 Long term (current) use of insulin: Secondary | ICD-10-CM | POA: Insufficient documentation

## 2013-04-28 DIAGNOSIS — H2510 Age-related nuclear cataract, unspecified eye: Secondary | ICD-10-CM | POA: Insufficient documentation

## 2013-04-28 DIAGNOSIS — Z79899 Other long term (current) drug therapy: Secondary | ICD-10-CM | POA: Insufficient documentation

## 2013-04-28 DIAGNOSIS — I10 Essential (primary) hypertension: Secondary | ICD-10-CM | POA: Insufficient documentation

## 2013-04-28 HISTORY — PX: CATARACT EXTRACTION W/PHACO: SHX586

## 2013-04-28 LAB — GLUCOSE, CAPILLARY: Glucose-Capillary: 203 mg/dL — ABNORMAL HIGH (ref 70–99)

## 2013-04-28 SURGERY — PHACOEMULSIFICATION, CATARACT, WITH IOL INSERTION
Anesthesia: Monitor Anesthesia Care | Site: Eye | Laterality: Left | Wound class: Clean

## 2013-04-28 MED ORDER — EPINEPHRINE HCL 1 MG/ML IJ SOLN
INTRAOCULAR | Status: DC | PRN
  Administered 2013-04-28: 11:00:00

## 2013-04-28 MED ORDER — PHENYLEPHRINE HCL 2.5 % OP SOLN
1.0000 [drp] | OPHTHALMIC | Status: AC
  Administered 2013-04-28 (×3): 1 [drp] via OPHTHALMIC

## 2013-04-28 MED ORDER — MIDAZOLAM HCL 2 MG/2ML IJ SOLN
1.0000 mg | INTRAMUSCULAR | Status: DC | PRN
  Administered 2013-04-28 (×2): 1 mg via INTRAVENOUS

## 2013-04-28 MED ORDER — PROVISC 10 MG/ML IO SOLN
INTRAOCULAR | Status: DC | PRN
  Administered 2013-04-28: 0.85 mL via INTRAOCULAR

## 2013-04-28 MED ORDER — EPINEPHRINE HCL 1 MG/ML IJ SOLN
INTRAMUSCULAR | Status: AC
  Filled 2013-04-28: qty 1

## 2013-04-28 MED ORDER — LIDOCAINE HCL 3.5 % OP GEL
1.0000 "application " | Freq: Once | OPHTHALMIC | Status: AC
  Administered 2013-04-28: 1 via OPHTHALMIC

## 2013-04-28 MED ORDER — TETRACAINE HCL 0.5 % OP SOLN
1.0000 [drp] | OPHTHALMIC | Status: AC
  Administered 2013-04-28 (×3): 1 [drp] via OPHTHALMIC

## 2013-04-28 MED ORDER — LACTATED RINGERS IV SOLN
INTRAVENOUS | Status: DC
  Administered 2013-04-28: 11:00:00 via INTRAVENOUS

## 2013-04-28 MED ORDER — POVIDONE-IODINE 5 % OP SOLN
OPHTHALMIC | Status: DC | PRN
  Administered 2013-04-28: 1 via OPHTHALMIC

## 2013-04-28 MED ORDER — TETRACAINE HCL 0.5 % OP SOLN
OPHTHALMIC | Status: AC
  Filled 2013-04-28: qty 2

## 2013-04-28 MED ORDER — FENTANYL CITRATE 0.05 MG/ML IJ SOLN
25.0000 ug | INTRAMUSCULAR | Status: AC
  Administered 2013-04-28 (×2): 25 ug via INTRAVENOUS

## 2013-04-28 MED ORDER — FENTANYL CITRATE 0.05 MG/ML IJ SOLN
INTRAMUSCULAR | Status: AC
  Filled 2013-04-28: qty 2

## 2013-04-28 MED ORDER — LIDOCAINE HCL 3.5 % OP GEL
OPHTHALMIC | Status: AC
  Filled 2013-04-28: qty 1

## 2013-04-28 MED ORDER — CYCLOPENTOLATE-PHENYLEPHRINE 0.2-1 % OP SOLN
1.0000 [drp] | OPHTHALMIC | Status: AC
  Administered 2013-04-28 (×3): 1 [drp] via OPHTHALMIC

## 2013-04-28 MED ORDER — LIDOCAINE HCL (PF) 1 % IJ SOLN
INTRAMUSCULAR | Status: AC
  Filled 2013-04-28: qty 2

## 2013-04-28 MED ORDER — NEOMYCIN-POLYMYXIN-DEXAMETH 3.5-10000-0.1 OP SUSP
OPHTHALMIC | Status: AC
  Filled 2013-04-28: qty 5

## 2013-04-28 MED ORDER — NEOMYCIN-POLYMYXIN-DEXAMETH 3.5-10000-0.1 OP SUSP
OPHTHALMIC | Status: DC | PRN
  Administered 2013-04-28: 2 [drp] via OPHTHALMIC

## 2013-04-28 MED ORDER — MIDAZOLAM HCL 2 MG/2ML IJ SOLN
INTRAMUSCULAR | Status: AC
  Filled 2013-04-28: qty 2

## 2013-04-28 MED ORDER — CYCLOPENTOLATE-PHENYLEPHRINE OP SOLN OPTIME - NO CHARGE
OPHTHALMIC | Status: AC
  Filled 2013-04-28: qty 2

## 2013-04-28 MED ORDER — LIDOCAINE HCL (PF) 1 % IJ SOLN
INTRAMUSCULAR | Status: DC | PRN
  Administered 2013-04-28: .4 mL

## 2013-04-28 MED ORDER — BSS IO SOLN
INTRAOCULAR | Status: DC | PRN
  Administered 2013-04-28: 15 mL via INTRAOCULAR

## 2013-04-28 MED ORDER — PHENYLEPHRINE HCL 2.5 % OP SOLN
OPHTHALMIC | Status: AC
  Filled 2013-04-28: qty 15

## 2013-04-28 MED ORDER — LIDOCAINE 3.5 % OP GEL OPTIME - NO CHARGE
OPHTHALMIC | Status: DC | PRN
  Administered 2013-04-28: 2 [drp] via OPHTHALMIC

## 2013-04-28 SURGICAL SUPPLY — 32 items

## 2013-04-28 NOTE — Anesthesia Postprocedure Evaluation (Signed)
  Anesthesia Post-op Note  Patient: Curtis Chan  Procedure(s) Performed: Procedure(s) (LRB): CATARACT EXTRACTION PHACO AND INTRAOCULAR LENS PLACEMENT LEFT EYE (Left)  Patient Location:  Short Stay  Anesthesia Type: MAC  Level of Consciousness: awake  Airway and Oxygen Therapy: Patient Spontanous Breathing  Post-op Pain: none  Post-op Assessment: Post-op Vital signs reviewed, Patient's Cardiovascular Status Stable, Respiratory Function Stable, Patent Airway, No signs of Nausea or vomiting and Pain level controlled  Post-op Vital Signs: Reviewed and stable  Complications: No apparent anesthesia complications

## 2013-04-28 NOTE — Transfer of Care (Signed)
Immediate Anesthesia Transfer of Care Note  Patient: Curtis Chan  Procedure(s) Performed: Procedure(s) (LRB): CATARACT EXTRACTION PHACO AND INTRAOCULAR LENS PLACEMENT LEFT EYE (Left)  Patient Location: Shortstay  Anesthesia Type: MAC  Level of Consciousness: awake  Airway & Oxygen Therapy: Patient Spontanous Breathing   Post-op Assessment: Report given to PACU RN, Post -op Vital signs reviewed and stable and Patient moving all extremities  Post vital signs: Reviewed and stable  Complications: No apparent anesthesia complications

## 2013-04-28 NOTE — Anesthesia Procedure Notes (Signed)
Procedure Name: MAC Date/Time: 04/28/2013 11:11 AM Performed by: Franco Nones Pre-anesthesia Checklist: Patient identified, Emergency Drugs available, Suction available, Timeout performed and Patient being monitored Patient Re-evaluated:Patient Re-evaluated prior to inductionOxygen Delivery Method: Nasal Cannula

## 2013-04-28 NOTE — H&P (Signed)
I have reviewed the H&P, the patient was re-examined, and I have identified no interval changes in medical condition and plan of care since the history and physical of record  

## 2013-04-28 NOTE — Anesthesia Preprocedure Evaluation (Signed)
Anesthesia Evaluation  Patient identified by MRN, date of birth, ID band Patient awake    Reviewed: Allergy & Precautions, H&P , NPO status , Patient's Chart, lab work & pertinent test results  Airway Mallampati: II TM Distance: >3 FB     Dental  (+) Edentulous Upper and Edentulous Lower   Pulmonary former smoker,  breath sounds clear to auscultation        Cardiovascular hypertension, + Peripheral Vascular Disease Rhythm:Regular Rate:Normal     Neuro/Psych CVA    GI/Hepatic negative GI ROS,   Endo/Other  diabetes, Poorly Controlled, Type 2  Renal/GU Renal disease     Musculoskeletal   Abdominal   Peds  Hematology   Anesthesia Other Findings   Reproductive/Obstetrics                           Anesthesia Physical Anesthesia Plan  ASA: III  Anesthesia Plan: MAC   Post-op Pain Management:    Induction: Intravenous  Airway Management Planned: Nasal Cannula  Additional Equipment:   Intra-op Plan:   Post-operative Plan:   Informed Consent: I have reviewed the patients History and Physical, chart, labs and discussed the procedure including the risks, benefits and alternatives for the proposed anesthesia with the patient or authorized representative who has indicated his/her understanding and acceptance.     Plan Discussed with:   Anesthesia Plan Comments:         Anesthesia Quick Evaluation  

## 2013-04-28 NOTE — Op Note (Signed)
Date of Admission: 04/28/2013  Date of Surgery: 04/28/2013  Pre-Op Dx: Cataract  Left  Eye  Post-Op Dx: Nuclear Cataract  Left  Eye,  Dx Code 366.16  Surgeon: Gemma Payor, M.D.  Assistants: None  Anesthesia: Topical with MAC  Indications: Painless, progressive loss of vision with compromise of daily activities.  Surgery: Cataract Extraction with Intraocular lens Implant Left Eye  Discription: The patient had dilating drops and viscous lidocaine placed into the left eye in the pre-op holding area. After transfer to the operating room, a time out was performed. The patient was then prepped and draped. Beginning with a 75 degree blade a paracentesis port was made at the surgeon's 2 o'clock position. The anterior chamber was then filled with 1% non-preserved lidocaine. This was followed by filling the anterior chamber with Provisc. A 2.73mm keratome blade was used to make a clear corneal incision at the temporal limbus. A bent cystatome needle was used to create a continuous tear capsulotomy. Hydrodissection was performed with balanced salt solution on a Fine canula. The lens nucleus was then removed using the phacoemulsification handpiece. Residual cortex was removed with the I&A handpiece. The anterior chamber and capsular bag were refilled with Provisc. A posterior chamber intraocular lens was placed into the capsular bag with it's injector. The implant was positioned with the Kuglan hook. The Provisc was then removed from the anterior chamber and capsular bag with the I&A handpiece. Stromal hydration of the main incision and paracentesis port was performed with BSS on a Fine canula. The wounds were tested for leak which was negative. The patient tolerated the procedure well. There were no operative complications. The patient was then transferred to the recovery room in stable condition.  Complications: None  Specimen: None  EBL: None  Prosthetic device: B&L enVista, MX60, power 20.0D, SN  [id number].

## 2013-04-29 ENCOUNTER — Telehealth: Payer: Self-pay | Admitting: General Practice

## 2013-04-29 ENCOUNTER — Other Ambulatory Visit: Payer: Self-pay | Admitting: General Practice

## 2013-04-29 NOTE — Telephone Encounter (Signed)
Called Curtis Chan he did not know what it was called so i called the pharmacy and they said the last thing he got from them is janumet 50/500 in January 2012 by doctor mcphail

## 2013-04-29 NOTE — Telephone Encounter (Signed)
Patient has no blood sugar medications listed in epic, please find out what meds he is referring to.

## 2013-04-30 ENCOUNTER — Encounter (HOSPITAL_COMMUNITY): Payer: Self-pay | Admitting: Ophthalmology

## 2013-04-30 ENCOUNTER — Other Ambulatory Visit: Payer: Self-pay | Admitting: Family Medicine

## 2013-04-30 ENCOUNTER — Telehealth: Payer: Self-pay | Admitting: Family Medicine

## 2013-04-30 ENCOUNTER — Ambulatory Visit (INDEPENDENT_AMBULATORY_CARE_PROVIDER_SITE_OTHER): Payer: Medicare Other | Admitting: Family Medicine

## 2013-04-30 VITALS — BP 116/61 | HR 90 | Temp 97.0°F | Ht 71.0 in | Wt 196.8 lb

## 2013-04-30 DIAGNOSIS — N184 Chronic kidney disease, stage 4 (severe): Secondary | ICD-10-CM

## 2013-04-30 DIAGNOSIS — R739 Hyperglycemia, unspecified: Secondary | ICD-10-CM

## 2013-04-30 DIAGNOSIS — E119 Type 2 diabetes mellitus without complications: Secondary | ICD-10-CM

## 2013-04-30 DIAGNOSIS — R7309 Other abnormal glucose: Secondary | ICD-10-CM

## 2013-04-30 DIAGNOSIS — I10 Essential (primary) hypertension: Secondary | ICD-10-CM

## 2013-04-30 LAB — POCT GLYCOSYLATED HEMOGLOBIN (HGB A1C): Hemoglobin A1C: 10.3

## 2013-04-30 MED ORDER — AMLODIPINE BESYLATE 10 MG PO TABS: 10.0000 mg | ORAL_TABLET | Freq: Every day | ORAL | Status: DC

## 2013-04-30 MED ORDER — INSULIN DETEMIR 100 UNIT/ML FLEXPEN: 16.0000 [IU] | PEN_INJECTOR | SUBCUTANEOUS | Status: DC

## 2013-04-30 MED ORDER — LISINOPRIL 20 MG PO TABS: 20.0000 mg | ORAL_TABLET | Freq: Every day | ORAL | Status: DC

## 2013-04-30 NOTE — Progress Notes (Signed)
  Subjective:    Patient ID: Curtis Chan, male    DOB: September 23, 1925, 79 y.o.   MRN: [id number]  HPI This 79 y.o. male presents for evaluation of hypertension and diabetes.  He was at his Eye doctor and had a glucose and it was 203 and he was advised to follow up with his Pcp.  He has hx of hypertension.  He has been getting surgery on his eyes by his eye Doctor who discovered he is not taking his diabetic medicine.  He has bmp showing Glucose of 410 and GFR of 26 and creatinine of 2.44.  He has no hx of CKD.  He Is taking janumet, hctz, amlodipine, and lisinopril.  He states he has not been taking the janumet.   Review of Systems C/o hyperglycemia No chest pain, SOB, HA, dizziness, vision change, N/V, diarrhea, constipation, dysuria, urinary urgency or frequency, myalgias, arthralgias or rash.     Objective:   Physical Exam Vital signs noted  Well developed well nourished male.  HEENT - Head atraumatic Normocephalic                Eyes - PERRLA, Conjuctiva - clear Sclera- Clear EOMI                Ears - EAC's Wnl TM's Wnl Gross Hearing WNL                Nose - Nares patent                 Throat - oropharanx wnl Respiratory - Lungs CTA bilateral Cardiac - RRR S1 and S2 without murmur GI - Abdomen soft Nontender and bowel sounds active x 4 Extremities - No edema. Neuro - Grossly intact.   Results for orders placed in visit on 04/30/13  POCT GLYCOSYLATED HEMOGLOBIN (HGB A1C)      Result Value Range   Hemoglobin A1C 10.3%        Assessment & Plan:  Hyperglycemia - Plan: POCT glycosylated hemoglobin (Hb A1C)  Diabetes - DC janumet and start levemir 16 units sq qd and increase 2 units every 3 days or one  Unit a day until fasting fsbs are below 140.  #1 sample given and patient is educated on how to use And he demonstrates he knows how by injecting himself in the abdomen.  Explained to rotate sites. Discussed with patient to follow up with Tammy Eckerd Pharm D for eval and  tx diabetes.  Essential hypertension, benign - Plan: CBC with Differential, CMP14+EGFR.  DC HCTZ  And continue lisinopril 20mg  poqd since K is normal.  Refer to Nephrology.  Continue amlodipine 10 Mg po qd.  CKD stage IV - Refer to Nephrology.  DC Janumet and HCTZ and remain on lisinopril and have Nephrology tx and adjust bp meds as seen fit.  Follow up in 2 weeks.

## 2013-04-30 NOTE — Telephone Encounter (Signed)
PT HAS APPT TODAY AT 11:45 WITH [name]

## 2013-04-30 NOTE — Telephone Encounter (Signed)
Will need to be seen and have hgbA1C drawn, fasting. Please have him schedule.

## 2013-04-30 NOTE — Patient Instructions (Signed)
ckdChronic Kidney Disease Chronic kidney disease occurs when the kidneys are damaged over a long period. The kidneys are two organs that lie on either side of the spine between the middle of the back and the front of the abdomen. The kidneys:   Remove wastes and extra water from the blood.   Produce important hormones. These help keep bones strong, regulate blood pressure, and help create red blood cells.   Balance the fluids and chemicals in the blood and tissues. A small amount of kidney damage may not cause problems, but a large amount of damage may make it difficult or impossible for the kidneys to work the way they should. If steps are not taken to slow down the kidney damage or stop it from getting worse, the kidneys may stop working permanently. Most of the time, chronic kidney disease does not go away. However, it can often be controlled, and those with the disease can usually live normal lives. CAUSES  The most common causes of chronic kidney disease are diabetes and high blood pressure (hypertension). Chronic kidney disease may also be caused by:   Diseases that cause kidneys' filters to become inflamed.   Diseases that affect the immune system.   Genetic diseases.   Medicines that damage the kidneys, such as anti-inflammatory medicines.  Poisoning or exposure to toxic substances.   A reoccurring kidney or urinary infection.   A problem with urine flow. This may be caused by:   Cancer.   Kidney stones.   An enlarged prostate in males. SYMPTOMS  Because the kidney damage in chronic kidney disease occurs slowly, symptoms develop slowly and may not be obvious until the kidney damage becomes severe. A person may have a kidney disease for years without showing any symptoms. Symptoms can include:   Swelling (edema) of the legs, ankles, or feet.   Tiredness (lethargy).   Nausea or vomiting.   Confusion.   Problems with urination, such as:   Decreased  urine production.   Frequent urination, especially at night.   Frequent accidents in children who are potty trained.   Muscle twitches and cramps.   Shortness of breath.  Weakness.   Persistent itchiness.   Loss of appetite.  Metallic taste in the mouth.  Trouble sleeping.  Slowed development in children.  Short stature in children. DIAGNOSIS  Chronic kidney disease may be detected and diagnosed by tests, including blood, urine, imaging, or kidney biopsy tests.  TREATMENT  Most chronic kidney diseases cannot be cured. Treatment usually involves relieving symptoms and preventing or slowing the progression of the disease. Treatment may include:   A special diet. You may need to avoid alcohol and foods thatare salty and high in potassium.   Medicines. These may:   Lower blood pressure.   Relieve anemia.   Relieve swelling.   Protect the bones. HOME CARE INSTRUCTIONS   Follow your prescribed diet.   Only take over-the-counter or prescription medicines as directed by your caregiver.  Do not take any new medicines (prescription, over-the-counter, or nutritional supplements) unless approved by your caregiver. Many medicines can worsen your kidney damage or need to have the dose adjusted.   Quit smoking if you are a smoker. Talk to your caregiver about a smoking cessation program.   Keep all follow-up appointments as directed by your caregiver. SEEK IMMEDIATE MEDICAL CARE IF:  Your symptoms get worse or you develop new symptoms.   You develop symptoms of end-stage kidney disease. These include:   Headaches.  Abnormally dark or light skin.   Numbness in the hands or feet.   Easy bruising.   Frequent hiccups.   Menstruation stops.   You have a fever.   You have decreased urine production.   You havepain or bleeding when urinating. MAKE SURE YOU:  Understand these instructions.  Will watch your condition.  Will get help  right away if you are not doing well or get worse. FOR MORE INFORMATION  American Association of Kidney Patients: ResidentialShow.is National Kidney Foundation: www.kidney.org American Kidney Fund: FightingMatch.com.ee [facility]: www.lifeoptions.org and www.kidneyschool.org Document Released: 03/14/2008 Document Revised: 05/22/2012 Document Reviewed: 02/02/2012 [facility] Patient Information 2014 [place], [place].

## 2013-05-01 LAB — CBC WITH DIFFERENTIAL/PLATELET
Basophils Absolute: 0 10*3/uL (ref 0.0–0.2)
Basos: 0 %
Eos: 2 %
Eosinophils Absolute: 0.2 10*3/uL (ref 0.0–0.4)
HCT: 40.7 % (ref 37.5–51.0)
Hemoglobin: 14.2 g/dL (ref 12.6–17.7)
Immature Grans (Abs): 0 10*3/uL (ref 0.0–0.1)
Immature Granulocytes: 0 %
Lymphocytes Absolute: 2.8 10*3/uL (ref 0.7–3.1)
Lymphs: 26 %
MCH: 30 pg (ref 26.6–33.0)
MCHC: 34.9 g/dL (ref 31.5–35.7)
MCV: 86 fL (ref 79–97)
Monocytes Absolute: 0.6 10*3/uL (ref 0.1–0.9)
Monocytes: 6 %
Neutrophils Absolute: 7.1 10*3/uL — ABNORMAL HIGH (ref 1.4–7.0)
Neutrophils Relative %: 66 %
RBC: 4.74 x10E6/uL (ref 4.14–5.80)
RDW: 13.2 % (ref 12.3–15.4)
WBC: 10.7 10*3/uL (ref 3.4–10.8)

## 2013-05-01 LAB — CMP14+EGFR
ALT: 6 IU/L (ref 0–44)
AST: 10 IU/L (ref 0–40)
Albumin/Globulin Ratio: 1.2 (ref 1.1–2.5)
Albumin: 4.3 g/dL (ref 3.5–4.7)
Alkaline Phosphatase: 108 IU/L (ref 39–117)
BUN/Creatinine Ratio: 22 (ref 10–22)
BUN: 50 mg/dL — ABNORMAL HIGH (ref 8–27)
CO2: 26 mmol/L (ref 18–29)
Calcium: 9.6 mg/dL (ref 8.6–10.2)
Chloride: 96 mmol/L — ABNORMAL LOW (ref 97–108)
Creatinine, Ser: 2.26 mg/dL — ABNORMAL HIGH (ref 0.76–1.27)
GFR calc Af Amer: 29 mL/min/{1.73_m2} — ABNORMAL LOW (ref >59)
GFR calc non Af Amer: 25 mL/min/{1.73_m2} — ABNORMAL LOW (ref >59)
Globulin, Total: 3.6 g/dL (ref 1.5–4.5)
Glucose: 234 mg/dL — ABNORMAL HIGH (ref 65–99)
Potassium: 4.7 mmol/L (ref 3.5–5.2)
Sodium: 139 mmol/L (ref 134–144)
Total Bilirubin: 0.6 mg/dL (ref 0.0–1.2)
Total Protein: 7.9 g/dL (ref 6.0–8.5)

## 2013-05-05 ENCOUNTER — Encounter: Payer: Self-pay | Admitting: Pharmacist

## 2013-05-05 ENCOUNTER — Ambulatory Visit (INDEPENDENT_AMBULATORY_CARE_PROVIDER_SITE_OTHER): Payer: Medicare Other | Admitting: Pharmacist

## 2013-05-05 VITALS — BP 134/60 | HR 85 | Ht 71.0 in | Wt 196.0 lb

## 2013-05-05 DIAGNOSIS — R7309 Other abnormal glucose: Secondary | ICD-10-CM

## 2013-05-05 DIAGNOSIS — E1159 Type 2 diabetes mellitus with other circulatory complications: Secondary | ICD-10-CM | POA: Insufficient documentation

## 2013-05-05 DIAGNOSIS — E119 Type 2 diabetes mellitus without complications: Secondary | ICD-10-CM

## 2013-05-05 DIAGNOSIS — IMO0002 Reserved for concepts with insufficient information to code with codable children: Secondary | ICD-10-CM

## 2013-05-05 DIAGNOSIS — R739 Hyperglycemia, unspecified: Secondary | ICD-10-CM

## 2013-05-05 DIAGNOSIS — I10 Essential (primary) hypertension: Secondary | ICD-10-CM

## 2013-05-05 DIAGNOSIS — E1165 Type 2 diabetes mellitus with hyperglycemia: Secondary | ICD-10-CM | POA: Insufficient documentation

## 2013-05-05 DIAGNOSIS — R799 Abnormal finding of blood chemistry, unspecified: Secondary | ICD-10-CM

## 2013-05-05 DIAGNOSIS — R7989 Other specified abnormal findings of blood chemistry: Secondary | ICD-10-CM

## 2013-05-05 MED ORDER — GLUCOSE BLOOD VI STRP: ORAL_STRIP | Status: AC

## 2013-05-05 NOTE — Progress Notes (Signed)
Diabetes Flow Sheet:  Visit 1  Chief Complaint:  No chief complaint on file.   Filed Vitals:   05/05/13 1622  BP: 134/60  Pulse: 85   Filed Weights   05/05/13 1622  Weight: 196 lb (88.905 kg)   HPI:  patient diagnosed with type 2 DM about 4 years ago.  He was started on Janumet 50/500mg  but he stopped because he felt like it was not helping.  Last week he was seen by Nils Pyle, FNP.  A1C was 10.3%.  He was started on Levemir 16 units daily.  Patient recently has eye surgery and there is much concern about proper healing in light of is uncontrolled type 2DM.  He also recently had a Serum creatinine that was over 2.0 on 2 occassions.  He has appt next week with nephrologist at [facility].   Exam Edema:  negatvie  Polyuria:  negative  Polydipsia:  negative Polyphagia:  negative  BMI:  Body mass index is 27.35 kg/(m^2).   Weight changes:  stable General Appearance:  alert, oriented, no acute distress and well nourished Mood/Affect:  normal  Low fat/carbohydrate diet?  No Nicotine Abuse?  No Medication Compliance?  No Exercise?  Yes Alcohol Abuse?  No  Glucose Readings   Lab Results  Component Value Date   HGBA1C 10.3% 04/30/2013    No results found for this basename: CHOL,  HDL,  LDLCALC,  LDLDIRECT,  TRIG,  CHOLHDL     Medication Checklist: ACE Inhibitor/ARB?  Yes Lipid Lowering Agent?  No Aspirin?  No Hypoglycemic Agent(s)?  Yes  Assessment: 1.  type 2 Diabetes.  - uncontrolled with complications 2.  Blood Pressure Control.  controlled 3.  Lipid Control.  Needed - patient refused labs today  Recommendations: 1.  1800 calorie, carbohydrate counting diet.  Patient is counseled extensively on carbohydrate counting, serving sizes, saturated fat intake and meal planning.  Patient is instructed to eat 3 meals a day and 3 small snacks.  Patient will supplement snacks based on physical activity. 2.  10-20 minutes of physical activity to start.   3.   Patient is counseled on pathophysiology of diabetes and the risk of long-term complications.  Fasting blood glucose goals are 80-120mg /dL.  Post-prandial goals are < 180.  A1C goals < 7.0%. 4.  LDL goal of < 100, HDL > 40 and TG < 150; BP goal < 140/80 5.  Patient is informed to test twice a day and how to respond to unsuitable results.  Rx was faxed to [facility]. 6.  Medication recommendations at this time are as follows:  Increase levemir to 20 units daily 7.  RTC next week to f/u with PCP.  RTC in 1 month to see [facility]  Time spent counseling patient:  45 minutes   Referring provider:  Nils Pyle, FNP   PharmD:  Henrene Pastor, [facility]

## 2013-05-14 ENCOUNTER — Encounter: Payer: Self-pay | Admitting: Family Medicine

## 2013-05-14 ENCOUNTER — Other Ambulatory Visit: Payer: Self-pay | Admitting: Nephrology

## 2013-05-14 ENCOUNTER — Encounter (INDEPENDENT_AMBULATORY_CARE_PROVIDER_SITE_OTHER): Payer: Self-pay

## 2013-05-14 ENCOUNTER — Ambulatory Visit (INDEPENDENT_AMBULATORY_CARE_PROVIDER_SITE_OTHER): Payer: Medicare Other | Admitting: Family Medicine

## 2013-05-14 VITALS — BP 143/82 | HR 105 | Temp 99.9°F | Ht 71.0 in | Wt 203.3 lb

## 2013-05-14 DIAGNOSIS — E119 Type 2 diabetes mellitus without complications: Secondary | ICD-10-CM

## 2013-05-14 DIAGNOSIS — N184 Chronic kidney disease, stage 4 (severe): Secondary | ICD-10-CM

## 2013-05-14 NOTE — Progress Notes (Signed)
  Subjective:    Patient ID: Curtis Chan, male    DOB: 1926-02-10, 79 y.o.   MRN: [id number]  HPI This 79 y.o. male presents for evaluation of diabetes.  He is taking basal insulin 20 units sq and fsbs are running In the 190's fasting.   He is doing better and feeling better.   Review of Systems No chest pain, SOB, HA, dizziness, vision change, N/V, diarrhea, constipation, dysuria, urinary urgency or frequency, myalgias, arthralgias or rash.     Objective:   Physical Exam Vital signs noted  Well developed well nourished male.  HEENT - Head atraumatic Normocephalic                Eyes - PERRLA, Conjuctiva - clear Sclera- Clear EOMI                Ears - EAC's Wnl TM's Wnl Gross Hearing WNL                Nose - Nares patent                 Throat - oropharanx wnl Respiratory - Lungs CTA bilateral Cardiac - RRR S1 and S2 without murmur GI - Abdomen soft Nontender and bowel sounds active x 4 Extremities - No edema. Neuro - Grossly intact.       Assessment & Plan:  Diabetes Increase basal insulin 2 units every 3 days until fsbs is below 140.  Increase basal insulin to 22 units sq.  Deatra Canter FNP

## 2013-05-23 ENCOUNTER — Encounter (HOSPITAL_COMMUNITY): Payer: Self-pay | Admitting: Ophthalmology

## 2013-05-23 ENCOUNTER — Other Ambulatory Visit: Payer: Medicare Other

## 2013-06-16 ENCOUNTER — Ambulatory Visit: Payer: Medicare Other | Admitting: Family Medicine

## 2013-06-17 ENCOUNTER — Encounter (HOSPITAL_COMMUNITY): Payer: Self-pay | Admitting: Pharmacy Technician

## 2013-06-17 MED ORDER — ONDANSETRON HCL 4 MG/2ML IJ SOLN: 4.0000 mg | Freq: Once | INTRAMUSCULAR | Status: AC | PRN

## 2013-06-17 MED ORDER — FENTANYL CITRATE 0.05 MG/ML IJ SOLN: 25.0000 ug | INTRAMUSCULAR | Status: DC | PRN

## 2013-06-18 ENCOUNTER — Encounter (HOSPITAL_COMMUNITY)
Admission: RE | Admit: 2013-06-18 | Discharge: 2013-06-18 | Disposition: A | Discharge status: Home / Self Care | Payer: Medicare Other | Source: Ambulatory Visit | Attending: Anesthesiology | Admitting: Anesthesiology

## 2013-06-24 MED ORDER — PHENYLEPHRINE HCL 2.5 % OP SOLN: 1.0000 [drp] | OPHTHALMIC | Status: AC

## 2013-06-24 MED ORDER — CYCLOPENTOLATE-PHENYLEPHRINE 0.2-1 % OP SOLN: 1.0000 [drp] | OPHTHALMIC | Status: AC

## 2013-06-24 MED ORDER — TETRACAINE HCL 0.5 % OP SOLN: 1.0000 [drp] | OPHTHALMIC | Status: AC

## 2013-06-24 MED ORDER — LIDOCAINE HCL 3.5 % OP GEL: 1.0000 "application " | Freq: Once | OPHTHALMIC | Status: DC

## 2013-06-25 MED ORDER — CYCLOPENTOLATE-PHENYLEPHRINE OP SOLN OPTIME - NO CHARGE
OPHTHALMIC | Status: AC
  Filled 2013-06-25: qty 2

## 2013-06-25 MED ORDER — NEOMYCIN-POLYMYXIN-DEXAMETH 3.5-10000-0.1 OP SUSP
OPHTHALMIC | Status: AC
  Filled 2013-06-25: qty 5

## 2013-06-25 MED ORDER — LIDOCAINE HCL 3.5 % OP GEL
OPHTHALMIC | Status: AC
  Filled 2013-06-25: qty 1

## 2013-06-25 MED ORDER — PHENYLEPHRINE HCL 2.5 % OP SOLN
OPHTHALMIC | Status: AC
  Filled 2013-06-25: qty 15

## 2013-06-25 MED ORDER — TETRACAINE HCL 0.5 % OP SOLN
OPHTHALMIC | Status: AC
  Filled 2013-06-25: qty 2

## 2013-06-26 ENCOUNTER — Encounter (HOSPITAL_COMMUNITY): Payer: Medicare Other | Admitting: Anesthesiology

## 2013-06-26 ENCOUNTER — Ambulatory Visit (HOSPITAL_COMMUNITY)
Admission: RE | Admit: 2013-06-26 | Discharge: 2013-06-26 | Disposition: A | Discharge status: Home / Self Care | Payer: Medicare Other | Source: Ambulatory Visit | Attending: Ophthalmology | Admitting: Ophthalmology

## 2013-06-26 ENCOUNTER — Encounter (HOSPITAL_COMMUNITY): Payer: Self-pay | Admitting: *Deleted

## 2013-06-26 ENCOUNTER — Ambulatory Visit (HOSPITAL_COMMUNITY): Payer: Medicare Other | Admitting: Anesthesiology

## 2013-06-26 ENCOUNTER — Encounter (HOSPITAL_COMMUNITY)
Admission: RE | Disposition: A | Discharge status: Home / Self Care | Payer: Self-pay | Source: Ambulatory Visit | Attending: Ophthalmology

## 2013-06-26 DIAGNOSIS — Z794 Long term (current) use of insulin: Secondary | ICD-10-CM | POA: Insufficient documentation

## 2013-06-26 DIAGNOSIS — E119 Type 2 diabetes mellitus without complications: Secondary | ICD-10-CM | POA: Insufficient documentation

## 2013-06-26 DIAGNOSIS — Z01812 Encounter for preprocedural laboratory examination: Secondary | ICD-10-CM | POA: Insufficient documentation

## 2013-06-26 DIAGNOSIS — H2510 Age-related nuclear cataract, unspecified eye: Secondary | ICD-10-CM | POA: Insufficient documentation

## 2013-06-26 DIAGNOSIS — I10 Essential (primary) hypertension: Secondary | ICD-10-CM | POA: Insufficient documentation

## 2013-06-26 DIAGNOSIS — Z79899 Other long term (current) drug therapy: Secondary | ICD-10-CM | POA: Insufficient documentation

## 2013-06-26 HISTORY — PX: CATARACT EXTRACTION W/PHACO: SHX586

## 2013-06-26 LAB — GLUCOSE, CAPILLARY: Glucose-Capillary: 139 mg/dL — ABNORMAL HIGH (ref 70–99)

## 2013-06-26 SURGERY — PHACOEMULSIFICATION, CATARACT, WITH IOL INSERTION
Anesthesia: Monitor Anesthesia Care | Site: Eye | Laterality: Right

## 2013-06-26 MED ORDER — NEOMYCIN-POLYMYXIN-DEXAMETH 3.5-10000-0.1 OP SUSP
OPHTHALMIC | Status: DC | PRN
  Administered 2013-06-26: 2 [drp] via OPHTHALMIC

## 2013-06-26 MED ORDER — EPINEPHRINE HCL 1 MG/ML IJ SOLN
INTRAMUSCULAR | Status: AC
  Filled 2013-06-26: qty 1

## 2013-06-26 MED ORDER — PHENYLEPHRINE HCL 2.5 % OP SOLN
1.0000 [drp] | OPHTHALMIC | Status: AC
  Administered 2013-06-26 (×3): 1 [drp] via OPHTHALMIC

## 2013-06-26 MED ORDER — TETRACAINE HCL 0.5 % OP SOLN
1.0000 [drp] | OPHTHALMIC | Status: AC
  Administered 2013-06-26 (×3): 1 [drp] via OPHTHALMIC

## 2013-06-26 MED ORDER — CYCLOPENTOLATE-PHENYLEPHRINE 0.2-1 % OP SOLN
1.0000 [drp] | OPHTHALMIC | Status: AC
  Administered 2013-06-26 (×3): 1 [drp] via OPHTHALMIC

## 2013-06-26 MED ORDER — LIDOCAINE HCL (PF) 1 % IJ SOLN
INTRAMUSCULAR | Status: DC | PRN
  Administered 2013-06-26: .5 mL

## 2013-06-26 MED ORDER — POVIDONE-IODINE 5 % OP SOLN
OPHTHALMIC | Status: DC | PRN
  Administered 2013-06-26: 1 via OPHTHALMIC

## 2013-06-26 MED ORDER — LACTATED RINGERS IV SOLN
INTRAVENOUS | Status: DC | PRN
  Administered 2013-06-26: 10:00:00 via INTRAVENOUS

## 2013-06-26 MED ORDER — FENTANYL CITRATE 0.05 MG/ML IJ SOLN: 25.0000 ug | INTRAMUSCULAR | Status: DC | PRN

## 2013-06-26 MED ORDER — PROVISC 10 MG/ML IO SOLN
INTRAOCULAR | Status: DC | PRN
  Administered 2013-06-26: 0.85 mL via INTRAOCULAR

## 2013-06-26 MED ORDER — MIDAZOLAM HCL 2 MG/2ML IJ SOLN
1.0000 mg | INTRAMUSCULAR | Status: DC | PRN
  Administered 2013-06-26: 2 mg via INTRAVENOUS
  Filled 2013-06-26: qty 2

## 2013-06-26 MED ORDER — ONDANSETRON HCL 4 MG/2ML IJ SOLN
4.0000 mg | Freq: Once | INTRAMUSCULAR | Status: DC | PRN
Start: 2013-06-26 — End: 2013-06-26

## 2013-06-26 MED ORDER — LIDOCAINE HCL 3.5 % OP GEL: 1.0000 "application " | Freq: Once | OPHTHALMIC | Status: DC

## 2013-06-26 MED ORDER — LACTATED RINGERS IV SOLN: INTRAVENOUS | Status: DC

## 2013-06-26 MED ORDER — FENTANYL CITRATE 0.05 MG/ML IJ SOLN
25.0000 ug | INTRAMUSCULAR | Status: AC
  Administered 2013-06-26: 25 ug via INTRAVENOUS
  Filled 2013-06-26: qty 2

## 2013-06-26 MED ORDER — EPINEPHRINE HCL 1 MG/ML IJ SOLN
INTRAOCULAR | Status: DC | PRN
  Administered 2013-06-26: 10:00:00

## 2013-06-26 MED ORDER — BSS IO SOLN
INTRAOCULAR | Status: DC | PRN
  Administered 2013-06-26: 15 mL via INTRAOCULAR

## 2013-06-26 SURGICAL SUPPLY — 33 items
CAPSULAR TENSION RING-AMO (OPHTHALMIC RELATED) IMPLANT
CLOTH BEACON ORANGE TIMEOUT ST (SAFETY) ×3 IMPLANT
EYE SHIELD UNIVERSAL CLEAR (GAUZE/BANDAGES/DRESSINGS) ×3 IMPLANT
GLOVE BIO SURGEON STRL SZ 6.5 (GLOVE) IMPLANT
GLOVE BIO SURGEONS STRL SZ 6.5 (GLOVE)
GLOVE BIOGEL PI IND STRL 6.5 (GLOVE) IMPLANT
GLOVE BIOGEL PI IND STRL 7.0 (GLOVE) ×2 IMPLANT
GLOVE BIOGEL PI IND STRL 7.5 (GLOVE) IMPLANT
GLOVE BIOGEL PI INDICATOR 6.5 (GLOVE)
GLOVE BIOGEL PI INDICATOR 7.0 (GLOVE) ×4
GLOVE BIOGEL PI INDICATOR 7.5 (GLOVE)
GLOVE ECLIPSE 6.5 STRL STRAW (GLOVE) IMPLANT
GLOVE ECLIPSE 7.0 STRL STRAW (GLOVE) IMPLANT
GLOVE ECLIPSE 7.5 STRL STRAW (GLOVE) IMPLANT
GLOVE EXAM NITRILE LRG STRL (GLOVE) IMPLANT
GLOVE EXAM NITRILE MD LF STRL (GLOVE) IMPLANT
GLOVE SKINSENSE NS SZ6.5 (GLOVE)
GLOVE SKINSENSE NS SZ7.0 (GLOVE)
GLOVE SKINSENSE STRL SZ6.5 (GLOVE) IMPLANT
GLOVE SKINSENSE STRL SZ7.0 (GLOVE) IMPLANT
KIT VITRECTOMY (OPHTHALMIC RELATED) IMPLANT
PAD ARMBOARD 7.5X6 YLW CONV (MISCELLANEOUS) ×3 IMPLANT
PROC W NO LENS (INTRAOCULAR LENS)
PROC W SPEC LENS (INTRAOCULAR LENS)
PROCESS W NO LENS (INTRAOCULAR LENS) IMPLANT
PROCESS W SPEC LENS (INTRAOCULAR LENS) IMPLANT
RING MALYGIN (MISCELLANEOUS) IMPLANT
SIGHTPATH CAT PROC W REG LENS (Ophthalmic Related) ×3 IMPLANT
SYR TB 1ML LL NO SAFETY (SYRINGE) ×3 IMPLANT
TAPE SURG TRANSPORE 1 IN (GAUZE/BANDAGES/DRESSINGS) ×1 IMPLANT
TAPE SURGICAL TRANSPORE 1 IN (GAUZE/BANDAGES/DRESSINGS) ×2
VISCOELASTIC ADDITIONAL (OPHTHALMIC RELATED) IMPLANT
WATER STERILE IRR 250ML POUR (IV SOLUTION) ×3 IMPLANT

## 2013-06-26 NOTE — Op Note (Signed)
Date of Admission: 06/26/2013  Date of Surgery: 06/26/2013  Pre-Op Dx: Cataract  Right  Eye  Post-Op Dx: Nuclear Cataract  Right  Eye,  Dx Code 366.16  Surgeon: Tonny Branch, M.D.  Assistants: None  Anesthesia: Topical with MAC  Indications: Painless, progressive loss of vision with compromise of daily activities.  Surgery: Cataract Extraction with Intraocular lens Implant Right Eye  Discription: The patient had dilating drops and viscous lidocaine placed into the right eye in the pre-op holding area. After transfer to the operating room, a time out was performed. The patient was then prepped and draped. Beginning with a 45 degree blade a paracentesis port was made at the surgeon's 2 o'clock position. The anterior chamber was then filled with 1% non-preserved lidocaine. This was followed by filling the anterior chamber with Provisc.  A 2.47mm keratome blade was used to make a clear corneal incision at the temporal limbus.  A bent cystatome needle was used to create a continuous tear capsulotomy. Hydrodissection was performed with balanced salt solution on a Fine canula. The lens nucleus was then removed using the phacoemulsification handpiece. Residual cortex was removed with the I&A handpiece. The anterior chamber and capsular bag were refilled with Provisc. A posterior chamber intraocular lens was placed into the capsular bag with it's injector. The implant was positioned with the Kuglan hook. The Provisc was then removed from the anterior chamber and capsular bag with the I&A handpiece. Stromal hydration of the main incision and paracentesis port was performed with BSS on a Fine canula. The wounds were tested for leak which was negative. The patient tolerated the procedure well. There were no operative complications. The patient was then transferred to the recovery room in stable condition.  Complications: None  Specimen: None  EBL: None  Prosthetic device: B&L enVista, MX60, power 19.5D, SN  [id number].

## 2013-06-26 NOTE — Anesthesia Postprocedure Evaluation (Signed)
  Anesthesia Post-op Note  Patient: Curtis Chan  Procedure(s) Performed: Procedure(s): CATARACT EXTRACTION PHACO AND INTRAOCULAR LENS PLACEMENT (IOC) CDE 13.44 (Right)  Patient Location: Short Stay  Anesthesia Type:MAC  Level of Consciousness: awake, alert , oriented and patient cooperative  Airway and Oxygen Therapy: Patient Spontanous Breathing  Post-op Pain: none  Post-op Assessment: Post-op Vital signs reviewed, Patient's Cardiovascular Status Stable, Respiratory Function Stable, Patent Airway, No signs of Nausea or vomiting and Pain level controlled  Post-op Vital Signs: Reviewed and stable  Complications: No apparent anesthesia complications

## 2013-06-26 NOTE — Transfer of Care (Signed)
Immediate Anesthesia Transfer of Care Note  Patient: Curtis Chan  Procedure(s) Performed: Procedure(s): CATARACT EXTRACTION PHACO AND INTRAOCULAR LENS PLACEMENT (IOC) CDE 13.44 (Right)  Patient Location: Short Stay  Anesthesia Type:MAC  Level of Consciousness: awake, alert , oriented and patient cooperative  Airway & Oxygen Therapy: Patient Spontanous Breathing  Post-op Assessment: Report given to PACU RN and Post -op Vital signs reviewed and stable  Post vital signs: Reviewed and stable  Complications: No apparent anesthesia complications

## 2013-06-26 NOTE — Anesthesia Procedure Notes (Signed)
Procedure Name: MAC Date/Time: 06/26/2013 10:04 AM Performed by: Andree Elk, Kamoni Gentles A Pre-anesthesia Checklist: Patient identified, Timeout performed, Emergency Drugs available, Suction available and Patient being monitored Oxygen Delivery Method: Nasal cannula

## 2013-06-26 NOTE — Anesthesia Preprocedure Evaluation (Signed)
Anesthesia Evaluation  Patient identified by MRN, date of birth, ID band Patient awake    Reviewed: Allergy & Precautions, H&P , NPO status , Patient's Chart, lab work & pertinent test results  Airway Mallampati: II TM Distance: >3 FB     Dental  (+) Edentulous Upper and Edentulous Lower   Pulmonary former smoker,  breath sounds clear to auscultation        Cardiovascular hypertension, + Peripheral Vascular Disease Rhythm:Regular Rate:Normal     Neuro/Psych CVA    GI/Hepatic negative GI ROS,   Endo/Other  diabetes, Poorly Controlled, Type 2  Renal/GU Renal disease     Musculoskeletal   Abdominal   Peds  Hematology   Anesthesia Other Findings   Reproductive/Obstetrics                           Anesthesia Physical Anesthesia Plan  ASA: III  Anesthesia Plan: MAC   Post-op Pain Management:    Induction: Intravenous  Airway Management Planned: Nasal Cannula  Additional Equipment:   Intra-op Plan:   Post-operative Plan:   Informed Consent: I have reviewed the patients History and Physical, chart, labs and discussed the procedure including the risks, benefits and alternatives for the proposed anesthesia with the patient or authorized representative who has indicated his/her understanding and acceptance.     Plan Discussed with:   Anesthesia Plan Comments:         Anesthesia Quick Evaluation

## 2013-06-26 NOTE — H&P (Signed)
I have reviewed the H&P, the patient was re-examined, and I have identified no interval changes in medical condition and plan of care since the history and physical of record  

## 2013-06-26 NOTE — Preoperative (Signed)
Beta Blockers   Reason not to administer Beta Blockers:Not Applicable 

## 2013-06-30 ENCOUNTER — Encounter (HOSPITAL_COMMUNITY): Payer: Self-pay | Admitting: Ophthalmology

## 2013-07-03 ENCOUNTER — Other Ambulatory Visit: Payer: Self-pay | Admitting: Pharmacist

## 2013-07-03 ENCOUNTER — Ambulatory Visit (INDEPENDENT_AMBULATORY_CARE_PROVIDER_SITE_OTHER): Payer: Medicare Other | Admitting: Pharmacist

## 2013-07-03 VITALS — BP 160/90 | HR 78 | Ht 71.0 in | Wt 215.0 lb

## 2013-07-03 DIAGNOSIS — I10 Essential (primary) hypertension: Secondary | ICD-10-CM

## 2013-07-03 DIAGNOSIS — E118 Type 2 diabetes mellitus with unspecified complications: Principal | ICD-10-CM

## 2013-07-03 DIAGNOSIS — IMO0002 Reserved for concepts with insufficient information to code with codable children: Secondary | ICD-10-CM

## 2013-07-03 DIAGNOSIS — E785 Hyperlipidemia, unspecified: Secondary | ICD-10-CM

## 2013-07-03 DIAGNOSIS — E1165 Type 2 diabetes mellitus with hyperglycemia: Secondary | ICD-10-CM

## 2013-07-03 NOTE — Patient Instructions (Signed)
Increase Levemir Insulin to 23 units once a day.  If after 5 days your blood sugar is still greater than 150 in the morning increase to 25 units a day.   Goal Blood Glucose / Sugar  Before a meal:  80 to 130  Within 2 hours of eating:  Less than 180

## 2013-07-03 NOTE — Progress Notes (Signed)
Diabetes Flow Sheet:  Visit 1  Chief Complaint:   Chief Complaint  Patient presents with  . Diabetes    Filed Vitals:   07/03/13 0824  BP: 160/90  Pulse: 78   Filed Weights   07/03/13 0824  Weight: 215 lb (97.523 kg)   HPI:  Type 2 Diabetes -  patient diagnosed with type 2 DM about 4 years ago.  He was started on Janumet 50/525m but he stopped because he felt like it was not helping.  He has recently also had 2 elevated serum creatinine values  Last  A1C was 10.3%.  He is taking Levemir 20 units daily ( he was suppose to increase per last note from PCP visit but he did not). Patient recently has eye surgery and there is much concern about proper healing in light of is uncontrolled type 2DM.    Hypertension and elevated SCr - He also had serum creatinine that was over 2.0 on 2 occassions during the last part of 2014.   [facility] and [facility] recommended several labs and testing but patient has declined to make follow up appt with them to have further testing performed.  According to previous medication list patient is not suppose to be taking lisinopril but he brings in an empty bottle and asks for refills today.  He also tells me that he has been taking lisinopril again.   Exam Edema:  negatvie  Polyuria:  negative  Polydipsia:  negative Polyphagia:  negative  BMI:  Body mass index is 30 kg/(m^2).   Weight changes:  stable General Appearance:  alert, oriented, no acute distress and well nourished Mood/Affect:  normal  Low fat/carbohydrate diet?  Improving - is paying more attention to serving sizes and limiting CHO intake Nicotine Abuse?  No Medication Compliance?  No Exercise?  Yes Alcohol Abuse?  No  Glucose Readings per patient recall - most in the 180's.  Lowest reading 137.  RBG was 174 in office today   Lab Results  Component Value Date   HGBA1C 10.3% 04/30/2013    No results found for this basename: CHOL,  HDL,  LDLCALC,  LDLDIRECT,  TRIG,   CHOLHDL     Assessment: 1.  type 2 Diabetes.  - uncontrolled with complications 2.  Hypertension - uncontrolled 3.  Lipid Control.  Needed - patient refused labs today 4.  Stage 4 chronic kidney disease  Recommendations: 1.  Continue 1800 calorie, carbohydrate counting diet.  Patient is counseled on CHO counting.  2.  Continue 10-20 minutes of physical activity daily   3.  Patient is counseled on pathophysiology of diabetes, uncontrolled HTN and kidney disease.  Fasting blood glucose goals are 80-1298mdL.  Post-prandial goals are < 180.  A1C goals < 7.0%. Reviewed goals with patient. 4.  LDL goal of < 100, HDL > 40 and TG < 150; BP goal < 140/80.   5.  Patient is informed to test BG twice a day and how to respond to unsuitable results.  6.  Medication recommendations at this time are as follows:    Increase levemir to 23 units daily.  If after 5 days BG is still greater than 150 in am then patient is to increase to 25 units daily.  Patient is instructed to hold lisinopril until results from Scr return.   7.   Orders Placed This Encounter  Procedures  . Microalbumin, urine  . BMP8+EGFR  and lipid panel was added to labs  Time spent counseling patient:  30.   minutes   Referring provider:  Stevan Born, [place], PharmD, CPP

## 2013-07-04 LAB — BMP8+EGFR
BUN/Creatinine Ratio: 13 (ref 10–22)
BUN: 19 mg/dL (ref 8–27)
CO2: 26 mmol/L (ref 18–29)
Calcium: 9.2 mg/dL (ref 8.6–10.2)
Chloride: 103 mmol/L (ref 97–108)
Creatinine, Ser: 1.44 mg/dL — ABNORMAL HIGH (ref 0.76–1.27)
GFR, EST AFRICAN AMERICAN: 51 mL/min/{1.73_m2} — AB (ref >59)
GFR, EST NON AFRICAN AMERICAN: 44 mL/min/{1.73_m2} — AB (ref >59)
GLUCOSE: 188 mg/dL — AB (ref 65–99)
Potassium: 4.6 mmol/L (ref 3.5–5.2)
Sodium: 144 mmol/L (ref 134–144)

## 2013-07-04 LAB — MICROALBUMIN, URINE: Microalbumin, Urine: 118.6 ug/mL — ABNORMAL HIGH (ref 0.0–17.0)

## 2013-07-07 ENCOUNTER — Telehealth: Payer: Self-pay | Admitting: Pharmacist

## 2013-07-07 ENCOUNTER — Telehealth: Payer: Self-pay | Admitting: *Deleted

## 2013-07-07 MED ORDER — PEN NEEDLES 31G X 6 MM MISC: Status: DC

## 2013-07-07 MED ORDER — LISINOPRIL 10 MG PO TABS: 10.0000 mg | ORAL_TABLET | Freq: Every day | ORAL | Status: DC

## 2013-07-07 NOTE — Telephone Encounter (Signed)
Patient needs a rx for insulin needles please to [facility]

## 2013-07-07 NOTE — Telephone Encounter (Signed)
Patient called about lab results.  Microalbumin was elevated.  Serum creatinine was improved.  Restart lisinopril at lower dose of 10 mg daily.  Continue amlodipine. appt set up to f/u with PCP in 1 month

## 2013-07-07 NOTE — Telephone Encounter (Signed)
Syringes or pen needles?  Patient called and verified he needs pen needles.  Rx sent to [facility] and patient aware.

## 2013-07-23 ENCOUNTER — Telehealth: Payer: Self-pay | Admitting: *Deleted

## 2013-07-23 NOTE — Telephone Encounter (Signed)
Received fax from [place] requesting refill on cephalexin 500mg  1 cap by mouth BID #20. Rx last filled on 04-22-13. Patient last seen in office on 07-03-13 by Tammy. Please advise

## 2013-07-23 NOTE — Telephone Encounter (Signed)
NTBS.

## 2013-07-24 NOTE — Telephone Encounter (Signed)
Patient says he does not need refill on antibiotic.

## 2013-08-04 ENCOUNTER — Ambulatory Visit (INDEPENDENT_AMBULATORY_CARE_PROVIDER_SITE_OTHER): Payer: Medicare Other | Admitting: Family Medicine

## 2013-08-04 ENCOUNTER — Encounter: Payer: Self-pay | Admitting: Family Medicine

## 2013-08-04 VITALS — BP 143/73 | HR 90 | Temp 97.0°F | Wt 210.6 lb

## 2013-08-04 DIAGNOSIS — E119 Type 2 diabetes mellitus without complications: Secondary | ICD-10-CM

## 2013-08-04 LAB — POCT GLYCOSYLATED HEMOGLOBIN (HGB A1C): Hemoglobin A1C: 7.5

## 2013-08-04 NOTE — Progress Notes (Signed)
   Subjective:    Patient ID: Derryl Harbor, male    DOB: Sep 04, 1925, 80 y.o.   MRN: [id number]  HPI This 80 y.o. male presents for evaluation of follow up.   He has hx of diabetes and hypertension And he needs hgbaic today.   Review of Systems No chest pain, SOB, HA, dizziness, vision change, N/V, diarrhea, constipation, dysuria, urinary urgency or frequency, myalgias, arthralgias or rash.     Objective:   Physical Exam Vital signs noted  Well developed well nourished male.  HEENT - Head atraumatic Normocephalic                Eyes - PERRLA, Conjuctiva - clear Sclera- Clear EOMI                Ears - EAC's Wnl TM's Wnl Gross Hearing WNL                Nose - Nares patent                 Throat - oropharanx wnl Respiratory - Lungs CTA bilateral Cardiac - RRR S1 and S2 without murmur        Assessment & Plan:  Diabetes - Plan: POCT glycosylated hemoglobin (Hb A1C) Continue current diabetic regimen.  Lysbeth Penner FNP

## 2013-08-04 NOTE — Patient Instructions (Signed)

## 2013-09-05 ENCOUNTER — Other Ambulatory Visit: Payer: Self-pay | Admitting: Pharmacist

## 2014-01-05 ENCOUNTER — Other Ambulatory Visit: Payer: Self-pay | Admitting: Family Medicine

## 2014-03-31 ENCOUNTER — Ambulatory Visit (INDEPENDENT_AMBULATORY_CARE_PROVIDER_SITE_OTHER): Payer: Medicare Other | Admitting: Family

## 2014-03-31 ENCOUNTER — Encounter: Payer: Self-pay | Admitting: Family

## 2014-03-31 VITALS — BP 201/108 | HR 107 | Temp 98.4°F | Ht 71.0 in | Wt 220.6 lb

## 2014-03-31 DIAGNOSIS — I10 Essential (primary) hypertension: Secondary | ICD-10-CM

## 2014-03-31 DIAGNOSIS — I739 Peripheral vascular disease, unspecified: Secondary | ICD-10-CM

## 2014-03-31 DIAGNOSIS — Z23 Encounter for immunization: Secondary | ICD-10-CM

## 2014-03-31 DIAGNOSIS — R350 Frequency of micturition: Secondary | ICD-10-CM

## 2014-03-31 DIAGNOSIS — R7989 Other specified abnormal findings of blood chemistry: Secondary | ICD-10-CM

## 2014-03-31 DIAGNOSIS — R609 Edema, unspecified: Secondary | ICD-10-CM

## 2014-03-31 DIAGNOSIS — N390 Urinary tract infection, site not specified: Secondary | ICD-10-CM

## 2014-03-31 DIAGNOSIS — IMO0001 Reserved for inherently not codable concepts without codable children: Secondary | ICD-10-CM

## 2014-03-31 DIAGNOSIS — R748 Abnormal levels of other serum enzymes: Secondary | ICD-10-CM

## 2014-03-31 LAB — POCT URINALYSIS DIPSTICK
BILIRUBIN UA: NEGATIVE
Blood, UA: NEGATIVE
Glucose, UA: NEGATIVE
Ketones, UA: NEGATIVE
LEUKOCYTES UA: NEGATIVE
NITRITE UA: NEGATIVE
Spec Grav, UA: 1.02
Urobilinogen, UA: 4
pH, UA: 6

## 2014-03-31 LAB — POCT UA - MICROSCOPIC ONLY
Casts, Ur, LPF, POC: NEGATIVE
Crystals, Ur, HPF, POC: NEGATIVE
RBC, URINE, MICROSCOPIC: NEGATIVE
Yeast, UA: NEGATIVE

## 2014-03-31 MED ORDER — AMLODIPINE BESYLATE 10 MG PO TABS: ORAL_TABLET | ORAL | Status: DC

## 2014-03-31 MED ORDER — FUROSEMIDE 20 MG PO TABS: 20.0000 mg | ORAL_TABLET | Freq: Every day | ORAL | Status: DC

## 2014-03-31 MED ORDER — CIPROFLOXACIN HCL 500 MG PO TABS: 500.0000 mg | ORAL_TABLET | Freq: Two times a day (BID) | ORAL | Status: DC

## 2014-03-31 MED ORDER — TAMSULOSIN HCL 0.4 MG PO CAPS: 0.4000 mg | ORAL_CAPSULE | Freq: Every day | ORAL | Status: DC

## 2014-03-31 NOTE — Patient Instructions (Signed)
Peripheral Edema °You have swelling in your legs (peripheral edema). This swelling is due to excess accumulation of salt and water in your body. Edema may be a sign of heart, kidney or liver disease, or a side effect of a medication. It may also be due to problems in the leg veins. Elevating your legs and using special support stockings may be very helpful, if the cause of the swelling is due to poor venous circulation. Avoid long periods of standing, whatever the cause. °Treatment of edema depends on identifying the cause. Chips, pretzels, pickles and other salty foods should be avoided. Restricting salt in your diet is almost always needed. Water pills (diuretics) are often used to remove the excess salt and water from your body via urine. These medicines prevent the kidney from reabsorbing sodium. This increases urine flow. °Diuretic treatment may also result in lowering of potassium levels in your body. Potassium supplements may be needed if you have to use diuretics daily. Daily weights can help you keep track of your progress in clearing your edema. You should call your caregiver for follow up care as recommended. °SEEK IMMEDIATE MEDICAL CARE IF:  °· You have increased swelling, pain, redness, or heat in your legs. °· You develop shortness of breath, especially when lying down. °· You develop chest or abdominal pain, weakness, or fainting. °· You have a fever. °Document Released: 07/13/2004 Document Revised: 08/28/2011 Document Reviewed: 06/23/2009 °ExitCare® Patient Information ©2015 [employer]. This information is not intended to replace advice given to you by your health care provider. Make sure you discuss any questions you have with your health care provider. ° °

## 2014-03-31 NOTE — Progress Notes (Signed)
Subjective:    Patient ID: Curtis Chan, male    DOB: 11/20/1925, 80 y.o.   MRN: [id number]  Pt presents to the office for urinary frequency, elevated BP, and leg swelling. Pt states last year he was told he was going to need dialysis. Pt states he would rather die then go on dialysis. Pt states he was on BP medication about a year ago, but it made him "pee every 15 mins " and he said he stopped taking the medication. Pt states he does not want to go back to "kidney doctor".   Pt states his legs started swelling last week and he does not want to lose his legs. Pt denies any pain or SOB. Pt states his legs are red and swelling.   Urinary Frequency  This is a chronic problem. The current episode started more than 1 year ago. Associated symptoms include frequency.      Review of Systems  Respiratory: Negative for shortness of breath.   Cardiovascular: Positive for leg swelling.  Genitourinary: Positive for frequency.  All other systems reviewed and are negative.      Objective:   Physical Exam  Vitals reviewed. Constitutional: He is oriented to person, place, and time. He appears well-developed and well-nourished. No distress.  Eyes: Pupils are equal, round, and reactive to light. Right eye exhibits no discharge. Left eye exhibits no discharge.  Neck: Normal range of motion. Neck supple. No thyromegaly present.  Cardiovascular: Normal rate, regular rhythm, normal heart sounds and intact distal pulses.   No murmur heard. Pulmonary/Chest: Effort normal and breath sounds normal. No respiratory distress. He has no wheezes.  Abdominal: Soft. Bowel sounds are normal. He exhibits no distension. There is no tenderness.  Musculoskeletal: Normal range of motion. He exhibits edema (2+ edema in BLE). He exhibits no tenderness.  Neurological: He is alert and oriented to person, place, and time. He has normal reflexes. No cranial nerve deficit.  Skin: Skin is warm and dry. No rash noted. There  is erythema (BLE).  Psychiatric: He has a normal mood and affect. His behavior is normal. Judgment and thought content normal.    BP 201/108  Pulse 107  Temp(Src) 98.4 F (36.9 C) (Oral)  Ht _0  (1.803 m)  Wt 220 lb 9.6 oz (100.064 kg)  BMI 30.78 kg/m2       Assessment & Plan:  1. Frequency - POCT urinalysis dipstick - POCT UA - Microscopic Only - tamsulosin (FLOMAX) 0.4 MG CAPS capsule; Take 1 capsule (0.4 mg total) by mouth daily.  Dispense: 30 capsule; Refill: 3 - BMP8+EGFR  2. Essential hypertension, benign - furosemide (LASIX) 20 MG tablet; Take 1 tablet (20 mg total) by mouth daily.  Dispense: 90 tablet; Refill: 3 - amLODipine (NORVASC) 10 MG tablet; TAKE 1 TABLET (10 MG TOTAL) BY MOUTH DAILY.  Dispense: 90 tablet; Refill: 1 - BMP8+EGFR  3. Elevated serum creatinine - BMP8+EGFR  4. PVD (peripheral vascular disease) - furosemide (LASIX) 20 MG tablet; Take 1 tablet (20 mg total) by mouth daily.  Dispense: 90 tablet; Refill: 3 - BMP8+EGFR  5. Peripheral edema - furosemide (LASIX) 20 MG tablet; Take 1 tablet (20 mg total) by mouth daily.  Dispense: 90 tablet; Refill: 3 - BMP8+EGFR  6. Urinary tract infection without hematuria, site unspecified - ciprofloxacin (CIPRO) 500 MG tablet; Take 1 tablet (500 mg total) by mouth 2 (two) times daily.  Dispense: 10 tablet; Refill: 0  RTO in 1 week Pt states he is  going to take medications and will f/u  Evelina Dun, FNP

## 2014-04-01 LAB — BMP8+EGFR
BUN / CREAT RATIO: 11 (ref 10–22)
BUN: 20 mg/dL (ref 8–27)
CO2: 24 mmol/L (ref 18–29)
CREATININE: 1.76 mg/dL — AB (ref 0.76–1.27)
Calcium: 9 mg/dL (ref 8.6–10.2)
Chloride: 104 mmol/L (ref 97–108)
GFR, EST AFRICAN AMERICAN: 40 mL/min/{1.73_m2} — AB (ref >59)
GFR, EST NON AFRICAN AMERICAN: 34 mL/min/{1.73_m2} — AB (ref >59)
Glucose: 155 mg/dL — ABNORMAL HIGH (ref 65–99)
Potassium: 4.7 mmol/L (ref 3.5–5.2)
Sodium: 143 mmol/L (ref 134–144)

## 2014-04-07 ENCOUNTER — Encounter: Payer: Self-pay | Admitting: Family Medicine

## 2014-04-07 ENCOUNTER — Ambulatory Visit (INDEPENDENT_AMBULATORY_CARE_PROVIDER_SITE_OTHER): Payer: Medicare Other | Admitting: Family Medicine

## 2014-04-07 VITALS — BP 142/84 | HR 113 | Temp 99.1°F | Ht 71.0 in | Wt 217.0 lb

## 2014-04-07 DIAGNOSIS — E0822 Diabetes mellitus due to underlying condition with diabetic chronic kidney disease: Secondary | ICD-10-CM

## 2014-04-07 LAB — POCT UA - MICROSCOPIC ONLY
Bacteria, U Microscopic: NEGATIVE
Casts, Ur, LPF, POC: NEGATIVE
Crystals, Ur, HPF, POC: NEGATIVE
Mucus, UA: NEGATIVE
RBC, urine, microscopic: NEGATIVE
WBC, Ur, HPF, POC: NEGATIVE
Yeast, UA: NEGATIVE

## 2014-04-07 LAB — POCT URINALYSIS DIPSTICK
Bilirubin, UA: NEGATIVE
Blood, UA: NEGATIVE
Glucose, UA: NEGATIVE
Ketones, UA: NEGATIVE
Leukocytes, UA: NEGATIVE
Nitrite, UA: NEGATIVE
Protein, UA: 300
Spec Grav, UA: 1.015
Urobilinogen, UA: NEGATIVE
pH, UA: 6.5

## 2014-04-07 LAB — POCT GLYCOSYLATED HEMOGLOBIN (HGB A1C): Hemoglobin A1C: 7

## 2014-04-07 NOTE — Progress Notes (Signed)
   Subjective:    Patient ID: Derryl Harbor, male    DOB: [birth year], 80 y.o.   MRN: [id number]  HPI This 80 y.o. male presents for evaluation of follow up on HTN.  He refuses Nephrology referral and states that he does not want dialysis and just wants [place] to take care of him.   Review of Systems No chest pain, SOB, HA, dizziness, vision change, N/V, diarrhea, constipation, dysuria, urinary urgency or frequency, myalgias, arthralgias or rash.     Objective:   Physical Exam Vital signs noted  Well developed well nourished male.  HEENT - Head atraumatic Normocephalic                Eyes - PERRLA, Conjuctiva - clear Sclera- Clear EOMI                Ears - EAC's Wnl TM's Wnl Gross Hearing WNL                Nose - Nares patent                 Throat - oropharanx wnl Respiratory - Lungs CTA bilateral Cardiac - RRR S1 and S2 without murmur GI - Abdomen soft Nontender and bowel sounds active x 4 Extremities - No edema. Neuro - Grossly intact.       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  CKD - Refuses Renal consult  HTN - Controlled.  Lysbeth Penner FNP

## 2014-04-13 ENCOUNTER — Other Ambulatory Visit: Payer: Self-pay | Admitting: Family Medicine

## 2014-06-10 ENCOUNTER — Other Ambulatory Visit: Payer: Self-pay | Admitting: Family Medicine

## 2014-07-16 ENCOUNTER — Telehealth: Payer: Self-pay | Admitting: Pharmacist

## 2014-07-16 NOTE — Telephone Encounter (Signed)
Patient is past due to have lipids and diabetes rechecked.  Appt made with Evelina Dun, NP for 9:30 07/22/14

## 2014-07-22 ENCOUNTER — Encounter: Payer: Self-pay | Admitting: Family

## 2014-07-22 ENCOUNTER — Ambulatory Visit (INDEPENDENT_AMBULATORY_CARE_PROVIDER_SITE_OTHER): Payer: Medicare Other | Admitting: Family

## 2014-07-22 VITALS — BP 167/88 | HR 92 | Temp 97.6°F | Ht 71.0 in | Wt 218.2 lb

## 2014-07-22 DIAGNOSIS — N400 Enlarged prostate without lower urinary tract symptoms: Secondary | ICD-10-CM

## 2014-07-22 DIAGNOSIS — IMO0002 Reserved for concepts with insufficient information to code with codable children: Secondary | ICD-10-CM

## 2014-07-22 DIAGNOSIS — E118 Type 2 diabetes mellitus with unspecified complications: Secondary | ICD-10-CM

## 2014-07-22 DIAGNOSIS — I10 Essential (primary) hypertension: Secondary | ICD-10-CM

## 2014-07-22 DIAGNOSIS — E1165 Type 2 diabetes mellitus with hyperglycemia: Secondary | ICD-10-CM

## 2014-07-22 DIAGNOSIS — Z23 Encounter for immunization: Secondary | ICD-10-CM

## 2014-07-22 DIAGNOSIS — Z1321 Encounter for screening for nutritional disorder: Secondary | ICD-10-CM

## 2014-07-22 LAB — POCT GLYCOSYLATED HEMOGLOBIN (HGB A1C)

## 2014-07-22 NOTE — Progress Notes (Signed)
Subjective:    Patient ID: Curtis Chan, male    DOB: May 19, 1926, 81 y.o.   MRN: [id number]  Diabetes He presents for his follow-up diabetic visit. He has type 2 diabetes mellitus. His disease course has been stable. Pertinent negatives for hypoglycemia include no confusion, dizziness, headaches, seizures or sleepiness. Associated symptoms include foot paresthesias. Pertinent negatives for diabetes include no foot ulcerations and no visual change. Pertinent negatives for hypoglycemia complications include no blackouts. Symptoms are stable. Diabetic complications include PVD. Pertinent negatives for diabetic complications include no CVA, heart disease, nephropathy or peripheral neuropathy. Risk factors for coronary artery disease include diabetes mellitus, hypertension, male sex and sedentary lifestyle. Current diabetic treatment includes insulin injections. He is compliant with treatment all of the time. He is following a generally healthy diet. (Pt states he does not check his blood glucose regularly ) An ACE inhibitor/angiotensin II receptor blocker is being taken. Eye exam is current.  Benign Prostatic Hypertrophy The current episode started more than 1 year ago. The problem is unchanged. Irritative symptoms include frequency and nocturia ("about 3 times"). Obstructive symptoms include an intermittent stream and a weak stream. Associated symptoms include hesitancy. Pertinent negatives include no hematuria. Past treatments include tamsulosin.  Hypertension This is a chronic problem. The current episode started more than 1 year ago. The problem has been waxing and waning since onset. The problem is uncontrolled. Pertinent negatives include no anxiety, headaches, palpitations, peripheral edema or shortness of breath. Risk factors for coronary artery disease include diabetes mellitus and male gender. Past treatments include ACE inhibitors and diuretics. Hypertensive end-organ damage includes PVD. There  is no history of kidney disease, CAD/MI, CVA, heart failure or a thyroid problem. There is no history of sleep apnea.      Review of Systems  Constitutional: Negative.   HENT: Negative.   Respiratory: Negative.  Negative for shortness of breath.   Cardiovascular: Negative.  Negative for palpitations.  Gastrointestinal: Negative.   Endocrine: Negative.   Genitourinary: Positive for hesitancy, frequency and nocturia ("about 3 times"). Negative for hematuria.  Musculoskeletal: Negative.   Neurological: Negative.  Negative for dizziness, seizures and headaches.  Hematological: Negative.   Psychiatric/Behavioral: Negative.  Negative for confusion.  All other systems reviewed and are negative.      Objective:   Physical Exam  Constitutional: He is oriented to person, place, and time. He appears well-developed and well-nourished. No distress.  HENT:  Head: Normocephalic.  Right Ear: External ear normal.  Left Ear: External ear normal.  Nose: Nose normal.  Mouth/Throat: Oropharynx is clear and moist.  Eyes: Pupils are equal, round, and reactive to light. Right eye exhibits no discharge. Left eye exhibits no discharge.  Neck: Normal range of motion. Neck supple. No thyromegaly present.  Cardiovascular: Normal rate, regular rhythm, normal heart sounds and intact distal pulses.   No murmur heard. Pulmonary/Chest: Effort normal and breath sounds normal. No respiratory distress. He has no wheezes.  Abdominal: Soft. Bowel sounds are normal. He exhibits no distension. There is no tenderness.  Musculoskeletal: Normal range of motion. He exhibits no edema or tenderness.  Neurological: He is alert and oriented to person, place, and time. He has normal reflexes. No cranial nerve deficit.  Skin: Skin is warm and dry. No rash noted. No erythema.  Psychiatric: He has a normal mood and affect. His behavior is normal. Judgment and thought content normal.  Vitals reviewed.   BP 167/88 mmHg  Pulse  92  Temp(Src) 97.6 F (36.4  C) (Oral)  Ht '5\' 11"'  (1.803 m)  Wt 218 lb 3.2 oz (98.975 kg)  BMI 30.45 kg/m2       Assessment & Plan:  1. Diabetes mellitus type 2 with complications, uncontrolled -Importance of checking blood sugar before giving insulin discussed  POCT glycosylated hemoglobin (Hb A1C) - CMP14+EGFR  2. Essential hypertension, benign - CMP14+EGFR  3. BPH (benign prostatic hyperplasia) - CMP14+EGFR  4. Encounter for vitamin deficiency screening - Vit D  25 hydroxy (rtn osteoporosis monitoring)   Continue all meds Labs pending Health Maintenance reviewed Diet and exercise encouraged RTO 3 months  Evelina Dun, FNP

## 2014-07-22 NOTE — Addendum Note (Signed)
Addended by: Shelbie Ammons on: 07/22/2014 10:52 AM   Modules accepted: Orders

## 2014-07-22 NOTE — Patient Instructions (Signed)

## 2014-07-23 ENCOUNTER — Other Ambulatory Visit: Payer: Self-pay | Admitting: Family

## 2014-07-23 LAB — CMP14+EGFR
ALT: 7 IU/L (ref 0–44)
AST: 15 IU/L (ref 0–40)
Albumin/Globulin Ratio: 1.2 (ref 1.1–2.5)
Albumin: 4.3 g/dL (ref 3.5–4.7)
Alkaline Phosphatase: 109 IU/L (ref 39–117)
BUN / CREAT RATIO: 11 (ref 10–22)
BUN: 21 mg/dL (ref 8–27)
CALCIUM: 9.4 mg/dL (ref 8.6–10.2)
CHLORIDE: 103 mmol/L (ref 97–108)
CO2: 22 mmol/L (ref 18–29)
CREATININE: 1.95 mg/dL — AB (ref 0.76–1.27)
GFR calc non Af Amer: 30 mL/min/{1.73_m2} — ABNORMAL LOW (ref >59)
GFR, EST AFRICAN AMERICAN: 35 mL/min/{1.73_m2} — AB (ref >59)
Globulin, Total: 3.5 g/dL (ref 1.5–4.5)
Glucose: 197 mg/dL — ABNORMAL HIGH (ref 65–99)
POTASSIUM: 4.5 mmol/L (ref 3.5–5.2)
SODIUM: 143 mmol/L (ref 134–144)
TOTAL PROTEIN: 7.8 g/dL (ref 6.0–8.5)
Total Bilirubin: 0.8 mg/dL (ref 0.0–1.2)

## 2014-07-23 LAB — VITAMIN D 25 HYDROXY (VIT D DEFICIENCY, FRACTURES): Vit D, 25-Hydroxy: 22.7 ng/mL — ABNORMAL LOW (ref 30.0–100.0)

## 2014-07-23 MED ORDER — VITAMIN D (ERGOCALCIFEROL) 1.25 MG (50000 UNIT) PO CAPS: 50000.0000 [IU] | ORAL_CAPSULE | ORAL | Status: DC

## 2014-08-17 ENCOUNTER — Other Ambulatory Visit: Payer: Self-pay | Admitting: Family

## 2014-08-18 ENCOUNTER — Other Ambulatory Visit: Payer: Self-pay

## 2014-09-12 ENCOUNTER — Other Ambulatory Visit: Payer: Self-pay | Admitting: Family Medicine

## 2014-09-14 ENCOUNTER — Other Ambulatory Visit: Payer: Self-pay

## 2014-09-14 MED ORDER — INSULIN DETEMIR 100 UNIT/ML FLEXPEN: PEN_INJECTOR | SUBCUTANEOUS | Status: DC

## 2014-09-16 ENCOUNTER — Other Ambulatory Visit: Payer: Self-pay | Admitting: Family

## 2014-09-17 ENCOUNTER — Other Ambulatory Visit: Payer: Self-pay | Admitting: *Deleted

## 2014-09-17 DIAGNOSIS — IMO0001 Reserved for inherently not codable concepts without codable children: Secondary | ICD-10-CM

## 2014-09-17 MED ORDER — TAMSULOSIN HCL 0.4 MG PO CAPS: 0.4000 mg | ORAL_CAPSULE | Freq: Every day | ORAL | Status: DC

## 2014-12-08 ENCOUNTER — Other Ambulatory Visit: Payer: Self-pay | Admitting: Family

## 2015-01-25 ENCOUNTER — Other Ambulatory Visit: Payer: Self-pay | Admitting: Family

## 2015-02-25 ENCOUNTER — Other Ambulatory Visit: Payer: Self-pay | Admitting: Family

## 2015-02-25 NOTE — Telephone Encounter (Signed)
PT needs to be seen. Medications refilled

## 2015-02-25 NOTE — Telephone Encounter (Signed)
Last seen 07/22/14  [facility]

## 2015-03-25 ENCOUNTER — Ambulatory Visit (INDEPENDENT_AMBULATORY_CARE_PROVIDER_SITE_OTHER): Payer: Medicare Other

## 2015-03-25 DIAGNOSIS — Z23 Encounter for immunization: Secondary | ICD-10-CM

## 2015-03-27 ENCOUNTER — Telehealth: Payer: Self-pay | Admitting: Family Medicine

## 2015-03-27 NOTE — Telephone Encounter (Signed)
Discussed with patient, He wants to hold levemir for a day b/c he thinks it is  Making him itch.   I explained its ok to hold X 1 day but restart tomorrow. I doubt that it is due to the insulin as he has been on it for quite some time.   I explained to restart tomorrow, if he is still itching to call us Monday to be seen and feasibly change to lantus.   Laroy Apple, MD [facility] 03/27/2015, 12:03 PM

## 2015-03-31 ENCOUNTER — Other Ambulatory Visit: Payer: Self-pay | Admitting: Family

## 2015-03-31 NOTE — Telephone Encounter (Signed)
Last seen 07/22/14  [facility]

## 2015-04-08 ENCOUNTER — Other Ambulatory Visit: Payer: Self-pay | Admitting: Family

## 2015-04-08 NOTE — Telephone Encounter (Signed)
Last seen 07/22/14  [facility]

## 2015-05-10 ENCOUNTER — Telehealth: Payer: Self-pay | Admitting: *Deleted

## 2015-05-10 NOTE — Telephone Encounter (Signed)
Patient needs an appointment to follow up on his diabetes. He has multiple gaps that need closing before the end of the year.  He needs: diabetic eye exam, microalbumin, and A1C He wasn't available when I called but his wife said that she would have him call me back this afternoon.

## 2015-05-20 ENCOUNTER — Encounter: Payer: Self-pay | Admitting: Family

## 2015-05-20 ENCOUNTER — Ambulatory Visit (INDEPENDENT_AMBULATORY_CARE_PROVIDER_SITE_OTHER): Payer: Medicare Other | Admitting: Family

## 2015-05-20 VITALS — BP 165/85 | HR 90 | Temp 97.2°F | Ht 71.0 in | Wt 226.0 lb

## 2015-05-20 DIAGNOSIS — Z794 Long term (current) use of insulin: Secondary | ICD-10-CM | POA: Diagnosis not present

## 2015-05-20 DIAGNOSIS — R748 Abnormal levels of other serum enzymes: Secondary | ICD-10-CM | POA: Diagnosis not present

## 2015-05-20 DIAGNOSIS — E1165 Type 2 diabetes mellitus with hyperglycemia: Secondary | ICD-10-CM

## 2015-05-20 DIAGNOSIS — I7025 Atherosclerosis of native arteries of other extremities with ulceration: Secondary | ICD-10-CM

## 2015-05-20 DIAGNOSIS — N400 Enlarged prostate without lower urinary tract symptoms: Secondary | ICD-10-CM | POA: Diagnosis not present

## 2015-05-20 DIAGNOSIS — I739 Peripheral vascular disease, unspecified: Secondary | ICD-10-CM | POA: Diagnosis not present

## 2015-05-20 DIAGNOSIS — IMO0002 Reserved for concepts with insufficient information to code with codable children: Secondary | ICD-10-CM

## 2015-05-20 DIAGNOSIS — I10 Essential (primary) hypertension: Secondary | ICD-10-CM | POA: Diagnosis not present

## 2015-05-20 DIAGNOSIS — R7989 Other specified abnormal findings of blood chemistry: Secondary | ICD-10-CM

## 2015-05-20 DIAGNOSIS — E118 Type 2 diabetes mellitus with unspecified complications: Secondary | ICD-10-CM | POA: Diagnosis not present

## 2015-05-20 LAB — POCT GLYCOSYLATED HEMOGLOBIN (HGB A1C): HEMOGLOBIN A1C: 9

## 2015-05-20 MED ORDER — TAMSULOSIN HCL 0.4 MG PO CAPS: 0.8000 mg | ORAL_CAPSULE | Freq: Every day | ORAL | Status: DC

## 2015-05-20 NOTE — Patient Instructions (Signed)

## 2015-05-20 NOTE — Progress Notes (Addendum)
Subjective:    Patient ID: Curtis Chan, male    DOB: 1926-02-27, 81 y.o.   MRN: [id number]  Pt presents to the office today for chronic follow up.  Diabetes He presents for his follow-up diabetic visit. He has type 2 diabetes mellitus. His disease course has been stable. Pertinent negatives for hypoglycemia include no confusion, dizziness, headaches, seizures or sleepiness. Associated symptoms include foot paresthesias. Pertinent negatives for diabetes include no foot ulcerations and no visual change. Pertinent negatives for hypoglycemia complications include no blackouts. Symptoms are stable. Diabetic complications include PVD. Pertinent negatives for diabetic complications include no CVA, heart disease, nephropathy or peripheral neuropathy. Risk factors for coronary artery disease include diabetes mellitus, hypertension, male sex and sedentary lifestyle. Current diabetic treatment includes insulin injections. He is compliant with treatment all of the time. He is following a generally healthy diet. (Pt states he does not check his blood glucose regularly ) An ACE inhibitor/angiotensin II receptor blocker is being taken. Eye exam is current.  Benign Prostatic Hypertrophy This is a chronic problem. The current episode started more than 1 year ago. The problem has been waxing and waning since onset. Irritative symptoms include frequency and nocturia ("about 3 times"). Obstructive symptoms include an intermittent stream and a weak stream. Associated symptoms include hesitancy. Pertinent negatives include no hematuria. Past treatments include tamsulosin.  Hypertension This is a chronic problem. The current episode started more than 1 year ago. The problem has been waxing and waning since onset. The problem is uncontrolled. Pertinent negatives include no anxiety, headaches, palpitations, peripheral edema or shortness of breath. Risk factors for coronary artery disease include diabetes mellitus and male  gender. Past treatments include ACE inhibitors and diuretics. Hypertensive end-organ damage includes PVD. There is no history of kidney disease, CAD/MI, CVA, heart failure or a thyroid problem. There is no history of sleep apnea.      Review of Systems  Constitutional: Negative.   HENT: Negative.   Respiratory: Negative.  Negative for shortness of breath.   Cardiovascular: Negative.  Negative for palpitations.  Gastrointestinal: Negative.   Endocrine: Negative.   Genitourinary: Positive for hesitancy, frequency and nocturia ("about 3 times"). Negative for hematuria.  Musculoskeletal: Negative.   Neurological: Negative.  Negative for dizziness, seizures and headaches.  Hematological: Negative.   Psychiatric/Behavioral: Negative.  Negative for confusion.  All other systems reviewed and are negative.      Objective:   Physical Exam  Constitutional: He is oriented to person, place, and time. He appears well-developed and well-nourished. No distress.  HENT:  Head: Normocephalic.  Right Ear: External ear normal.  Left Ear: External ear normal.  Nose: Nose normal.  Mouth/Throat: Oropharynx is clear and moist.  Eyes: Pupils are equal, round, and reactive to light. Right eye exhibits no discharge. Left eye exhibits no discharge.  Neck: Normal range of motion. Neck supple. No thyromegaly present.  Cardiovascular: Normal rate, regular rhythm and intact distal pulses.   Murmur heard. Pulmonary/Chest: Effort normal and breath sounds normal. No respiratory distress. He has no wheezes.  Abdominal: Soft. Bowel sounds are normal. He exhibits no distension. There is no tenderness.  Musculoskeletal: Normal range of motion. He exhibits no edema or tenderness.  Neurological: He is alert and oriented to person, place, and time. He has normal reflexes. No cranial nerve deficit.  Skin: Skin is warm and dry. No rash noted. No erythema.  Psychiatric: He has a normal mood and affect. His behavior is  normal. Judgment and thought content  normal.  Vitals reviewed.     BP 165/85 mmHg  Pulse 90  Temp(Src) 97.2 F (36.2 C) (Oral)  Ht _0  (1.803 m)  Wt 226 lb (102.513 kg)  BMI 31.53 kg/m2     Assessment & Plan:  1. Atherosclerosis of native arteries of the extremities with ulceration ([place]) - CMP14+EGFR - Lipid panel  2. Uncontrolled type 2 diabetes mellitus with complication, with long-term current use of insulin (HCC) - POCT glycosylated hemoglobin (Hb A1C) - CMP14+EGFR - Ambulatory referral to Ophthalmology - Microalbumin / creatinine urine ratio   3. Elevated serum creatinine - CMP14+EGFR  4. BPH (benign prostatic hyperplasia) -Flomax increased to 0.8 mg daily from 0.4 mg daily - CMP14+EGFR - tamsulosin (FLOMAX) 0.4 MG CAPS capsule; Take 2 capsules (0.8 mg total) by mouth daily.  Dispense: 180 capsule; Refill: 0  5. PVD (peripheral vascular disease) (HCC) - CMP14+EGFR - Lipid panel  6. Essential hypertension, benign - CMP14+EGFR    Continue all meds Labs pending Health Maintenance reviewed Diet and exercise encouraged RTO 3 months  Evelina Dun, FNP

## 2015-05-20 NOTE — Addendum Note (Signed)
Addended by: Evelina Dun A on: 05/20/2015 10:20 AM   Modules accepted: Orders

## 2015-05-21 ENCOUNTER — Other Ambulatory Visit: Payer: Self-pay | Admitting: Family

## 2015-05-21 LAB — MICROALBUMIN / CREATININE URINE RATIO
CREATININE, UR: 156.8 mg/dL
MICROALB/CREAT RATIO: 105.2 mg/g creat — ABNORMAL HIGH (ref 0.0–30.0)
Microalbumin, Urine: 164.9 ug/mL

## 2015-05-21 LAB — CMP14+EGFR
A/G RATIO: 1.2 (ref 1.1–2.5)
ALK PHOS: 104 IU/L (ref 39–117)
ALT: 6 IU/L (ref 0–44)
AST: 17 IU/L (ref 0–40)
Albumin: 4.1 g/dL (ref 3.5–4.7)
BILIRUBIN TOTAL: 1 mg/dL (ref 0.0–1.2)
BUN/Creatinine Ratio: 12 (ref 10–22)
BUN: 23 mg/dL (ref 8–27)
CO2: 22 mmol/L (ref 18–29)
Calcium: 9.1 mg/dL (ref 8.6–10.2)
Chloride: 102 mmol/L (ref 97–106)
Creatinine, Ser: 1.89 mg/dL — ABNORMAL HIGH (ref 0.76–1.27)
GFR calc non Af Amer: 31 mL/min/{1.73_m2} — ABNORMAL LOW (ref >59)
GFR, EST AFRICAN AMERICAN: 36 mL/min/{1.73_m2} — AB (ref >59)
Globulin, Total: 3.4 g/dL (ref 1.5–4.5)
Glucose: 215 mg/dL — ABNORMAL HIGH (ref 65–99)
POTASSIUM: 4.1 mmol/L (ref 3.5–5.2)
Sodium: 140 mmol/L (ref 136–144)
TOTAL PROTEIN: 7.5 g/dL (ref 6.0–8.5)

## 2015-05-21 LAB — LIPID PANEL
CHOL/HDL RATIO: 4.6 ratio (ref 0.0–5.0)
Cholesterol, Total: 195 mg/dL (ref 100–199)
HDL: 42 mg/dL (ref >39)
LDL Calculated: 134 mg/dL — ABNORMAL HIGH (ref 0–99)
Triglycerides: 97 mg/dL (ref 0–149)
VLDL CHOLESTEROL CAL: 19 mg/dL (ref 5–40)

## 2015-05-26 ENCOUNTER — Other Ambulatory Visit: Payer: Self-pay | Admitting: Family

## 2015-06-03 DIAGNOSIS — Z794 Long term (current) use of insulin: Secondary | ICD-10-CM | POA: Diagnosis not present

## 2015-06-03 DIAGNOSIS — E119 Type 2 diabetes mellitus without complications: Secondary | ICD-10-CM | POA: Diagnosis not present

## 2015-06-03 DIAGNOSIS — Z7984 Long term (current) use of oral hypoglycemic drugs: Secondary | ICD-10-CM | POA: Diagnosis not present

## 2015-06-03 DIAGNOSIS — Z961 Presence of intraocular lens: Secondary | ICD-10-CM | POA: Diagnosis not present

## 2015-06-03 LAB — HM DIABETES EYE EXAM

## 2015-06-08 ENCOUNTER — Encounter: Payer: Self-pay | Admitting: *Deleted

## 2015-07-07 ENCOUNTER — Telehealth: Payer: Self-pay | Admitting: Family

## 2015-07-07 NOTE — Telephone Encounter (Signed)
He will call when he wants to come in

## 2015-08-26 ENCOUNTER — Other Ambulatory Visit: Payer: Self-pay | Admitting: Family

## 2015-09-20 ENCOUNTER — Other Ambulatory Visit: Payer: Self-pay | Admitting: Family

## 2015-10-07 ENCOUNTER — Telehealth: Payer: Self-pay | Admitting: Family

## 2015-11-25 ENCOUNTER — Other Ambulatory Visit: Payer: Self-pay | Admitting: Family

## 2015-12-02 ENCOUNTER — Telehealth: Payer: Self-pay

## 2015-12-02 DIAGNOSIS — H3582 Retinal ischemia: Secondary | ICD-10-CM

## 2015-12-02 DIAGNOSIS — H11153 Pinguecula, bilateral: Secondary | ICD-10-CM | POA: Diagnosis not present

## 2015-12-02 DIAGNOSIS — H3563 Retinal hemorrhage, bilateral: Secondary | ICD-10-CM | POA: Diagnosis not present

## 2015-12-02 DIAGNOSIS — E119 Type 2 diabetes mellitus without complications: Secondary | ICD-10-CM | POA: Diagnosis not present

## 2015-12-02 DIAGNOSIS — D3100 Benign neoplasm of unspecified conjunctiva: Secondary | ICD-10-CM | POA: Diagnosis not present

## 2015-12-02 NOTE — Telephone Encounter (Signed)
Was examine by Dr Rona Ravens at [facility] and she recommends we order a carotid doppler for occular ischemic syndrome

## 2015-12-06 ENCOUNTER — Other Ambulatory Visit: Payer: Self-pay | Admitting: Family

## 2015-12-08 ENCOUNTER — Other Ambulatory Visit: Payer: Self-pay | Admitting: Family

## 2015-12-20 ENCOUNTER — Other Ambulatory Visit: Payer: Self-pay | Admitting: Family

## 2016-01-10 ENCOUNTER — Ambulatory Visit: Payer: Medicare Other | Admitting: Family

## 2016-01-19 ENCOUNTER — Ambulatory Visit: Payer: Medicare Other | Admitting: Family

## 2016-01-20 ENCOUNTER — Encounter: Payer: Self-pay | Admitting: Family

## 2016-01-31 ENCOUNTER — Encounter: Payer: Self-pay | Admitting: Family

## 2016-01-31 ENCOUNTER — Ambulatory Visit (INDEPENDENT_AMBULATORY_CARE_PROVIDER_SITE_OTHER): Payer: Medicare Other | Admitting: Family

## 2016-01-31 VITALS — BP 162/83 | HR 84 | Temp 97.3°F | Ht 71.0 in | Wt 205.4 lb

## 2016-01-31 DIAGNOSIS — IMO0002 Reserved for concepts with insufficient information to code with codable children: Secondary | ICD-10-CM

## 2016-01-31 DIAGNOSIS — E1165 Type 2 diabetes mellitus with hyperglycemia: Secondary | ICD-10-CM

## 2016-01-31 DIAGNOSIS — E118 Type 2 diabetes mellitus with unspecified complications: Secondary | ICD-10-CM | POA: Diagnosis not present

## 2016-01-31 DIAGNOSIS — I7025 Atherosclerosis of native arteries of other extremities with ulceration: Secondary | ICD-10-CM

## 2016-01-31 DIAGNOSIS — Z794 Long term (current) use of insulin: Secondary | ICD-10-CM | POA: Diagnosis not present

## 2016-01-31 DIAGNOSIS — N400 Enlarged prostate without lower urinary tract symptoms: Secondary | ICD-10-CM

## 2016-01-31 DIAGNOSIS — I10 Essential (primary) hypertension: Secondary | ICD-10-CM

## 2016-01-31 DIAGNOSIS — R7989 Other specified abnormal findings of blood chemistry: Secondary | ICD-10-CM

## 2016-01-31 DIAGNOSIS — I739 Peripheral vascular disease, unspecified: Secondary | ICD-10-CM | POA: Diagnosis not present

## 2016-01-31 DIAGNOSIS — R748 Abnormal levels of other serum enzymes: Secondary | ICD-10-CM | POA: Diagnosis not present

## 2016-01-31 LAB — BAYER DCA HB A1C WAIVED: HB A1C (BAYER DCA - WAIVED): 6.4 % (ref <7.0)

## 2016-01-31 MED ORDER — NEBIVOLOL HCL 5 MG PO TABS: 5.0000 mg | ORAL_TABLET | Freq: Every day | ORAL | 1 refills | Status: DC

## 2016-01-31 MED ORDER — DULAGLUTIDE 0.75 MG/0.5ML ~~LOC~~ SOAJ: 0.7500 mg | SUBCUTANEOUS | 3 refills | Status: DC

## 2016-01-31 NOTE — Patient Instructions (Signed)

## 2016-01-31 NOTE — Progress Notes (Signed)
Subjective:    Patient ID: Curtis Chan, male    DOB: 12-08-1925, 82 y.o.   MRN: [id number]  Pt presents to the office today for chronic follow up. Pt has elevated serum creatinine, but does not want to see nephrologists. Pt is noncompliant with his diabetic medications. Pt has PVD and CAD but has been years since following Cardiologists .  Diabetes  He presents for his follow-up diabetic visit. He has type 2 diabetes mellitus. His disease course has been stable. Pertinent negatives for hypoglycemia include no confusion, dizziness, headaches, seizures or sleepiness. Associated symptoms include foot paresthesias. Pertinent negatives for diabetes include no foot ulcerations and no visual change. Pertinent negatives for hypoglycemia complications include no blackouts. Symptoms are stable. Diabetic complications include PVD. Pertinent negatives for diabetic complications include no CVA, heart disease, nephropathy or peripheral neuropathy. Risk factors for coronary artery disease include diabetes mellitus, hypertension, male sex and sedentary lifestyle. Current diabetic treatment includes insulin injections. He is compliant with treatment all of the time. He is following a generally healthy diet. (Pt states he does not check his blood glucose regularly ) An ACE inhibitor/angiotensin II receptor blocker is being taken. Eye exam is current.  Benign Prostatic Hypertrophy  This is a chronic problem. The current episode started more than 1 year ago. The problem has been waxing and waning since onset. Irritative symptoms include frequency. Nocturia: "about 3 times" Obstructive symptoms include an intermittent stream and a weak stream. Associated symptoms include hesitancy. Pertinent negatives include no hematuria. Past treatments include tamsulosin. The treatment provided mild relief.  Hypertension  This is a chronic problem. The current episode started more than 1 year ago. The problem has been waxing and  waning since onset. The problem is uncontrolled. Pertinent negatives include no anxiety, headaches, palpitations, peripheral edema or shortness of breath. Risk factors for coronary artery disease include diabetes mellitus and male gender. Past treatments include ACE inhibitors and diuretics. Hypertensive end-organ damage includes PVD. There is no history of kidney disease, CAD/MI, CVA, heart failure or a thyroid problem. There is no history of sleep apnea.      Review of Systems  Constitutional: Negative.   HENT: Negative.   Respiratory: Negative.  Negative for shortness of breath.   Cardiovascular: Negative.  Negative for palpitations.  Gastrointestinal: Negative.   Endocrine: Negative.   Genitourinary: Positive for frequency and hesitancy. Negative for hematuria. Nocturia: "about 3 times"  Musculoskeletal: Negative.   Neurological: Negative.  Negative for dizziness, seizures and headaches.  Hematological: Negative.   Psychiatric/Behavioral: Negative.  Negative for confusion.  All other systems reviewed and are negative.      Objective:   Physical Exam  Constitutional: He is oriented to person, place, and time. He appears well-developed and well-nourished. No distress.  HENT:  Head: Normocephalic.  Right Ear: External ear normal.  Left Ear: External ear normal.  Nose: Nose normal.  Mouth/Throat: Oropharynx is clear and moist.  Eyes: Pupils are equal, round, and reactive to light. Right eye exhibits no discharge. Left eye exhibits no discharge.  Neck: Normal range of motion. Neck supple. No thyromegaly present.  Cardiovascular: Normal rate, regular rhythm and intact distal pulses.   Murmur heard. Pulmonary/Chest: Effort normal and breath sounds normal. No respiratory distress. He has no wheezes.  Abdominal: Soft. Bowel sounds are normal. He exhibits no distension. There is no tenderness.  Musculoskeletal: Normal range of motion. He exhibits no edema or tenderness.  Neurological:  He is alert and oriented to person, place,  and time. He has normal reflexes. No cranial nerve deficit.  Skin: Skin is warm and dry. No rash noted. No erythema.  Psychiatric: He has a normal mood and affect. His behavior is normal. Judgment and thought content normal.  Vitals reviewed.     BP (!) 160/81   Pulse 80   Temp 97.3 F (36.3 C) (Oral)   Ht '5\' 11"'  (1.803 m)   Wt 205 lb 6.4 oz (93.2 kg)   BMI 28.65 kg/m      Assessment & Plan:  1. Atherosclerosis of native arteries of the extremities with ulceration ([place]) - CMP14+EGFR  2. Essential hypertension, benign -Pt started on Bystolic 5 mg today -RTO in 2 weeks - CMP14+EGFR - nebivolol (BYSTOLIC) 5 MG tablet; Take 1 tablet (5 mg total) by mouth daily.  Dispense: 30 tablet; Refill: 1  3. PVD (peripheral vascular disease) (HCC) - CMP14+EGFR  4. Uncontrolled type 2 diabetes mellitus with complication, with long-term current use of insulin (HCC) -Pt started on Trulicity today -Discussed low carb diet - CMP14+EGFR - Bayer DCA Hb A1c Waived - Microalbumin / creatinine urine ratio - Dulaglutide (TRULICITY) 3.57 IX/7.8ER SOPN; Inject 0.75 mg into the skin once a week.  Dispense: 12 pen; Refill: 3  5. BPH (benign prostatic hyperplasia) - CMP14+EGFR  6. Elevated serum creatinine - CMP14+EGFR   Continue all meds Labs pending Health Maintenance reviewed Diet and exercise encouraged RTO 2 weeks   Evelina Dun, FNP

## 2016-02-01 ENCOUNTER — Other Ambulatory Visit: Payer: Self-pay | Admitting: Family

## 2016-02-01 LAB — MICROALBUMIN / CREATININE URINE RATIO
Creatinine, Urine: 65.1 mg/dL
MICROALB/CREAT RATIO: 213.8 mg/g creat — ABNORMAL HIGH (ref 0.0–30.0)
MICROALBUM., U, RANDOM: 139.2 ug/mL

## 2016-02-01 LAB — CMP14+EGFR
ALBUMIN: 4.4 g/dL (ref 3.5–4.7)
ALT: 10 IU/L (ref 0–44)
AST: 21 IU/L (ref 0–40)
Albumin/Globulin Ratio: 1.4 (ref 1.2–2.2)
Alkaline Phosphatase: 101 IU/L (ref 39–117)
BUN / CREAT RATIO: 11 (ref 10–24)
BUN: 20 mg/dL (ref 8–27)
Bilirubin Total: 0.6 mg/dL (ref 0.0–1.2)
CALCIUM: 9 mg/dL (ref 8.6–10.2)
CO2: 23 mmol/L (ref 18–29)
CREATININE: 1.8 mg/dL — AB (ref 0.76–1.27)
Chloride: 104 mmol/L (ref 96–106)
GFR, EST AFRICAN AMERICAN: 38 mL/min/{1.73_m2} — AB (ref >59)
GFR, EST NON AFRICAN AMERICAN: 33 mL/min/{1.73_m2} — AB (ref >59)
GLOBULIN, TOTAL: 3.1 g/dL (ref 1.5–4.5)
Glucose: 114 mg/dL — ABNORMAL HIGH (ref 65–99)
Potassium: 4.6 mmol/L (ref 3.5–5.2)
SODIUM: 144 mmol/L (ref 134–144)
TOTAL PROTEIN: 7.5 g/dL (ref 6.0–8.5)

## 2016-02-14 ENCOUNTER — Ambulatory Visit (INDEPENDENT_AMBULATORY_CARE_PROVIDER_SITE_OTHER): Payer: Medicare Other | Admitting: Family

## 2016-02-14 ENCOUNTER — Encounter: Payer: Self-pay | Admitting: Family

## 2016-02-14 VITALS — BP 125/62 | HR 67 | Temp 97.0°F | Ht 71.0 in | Wt 204.0 lb

## 2016-02-14 DIAGNOSIS — I10 Essential (primary) hypertension: Secondary | ICD-10-CM | POA: Diagnosis not present

## 2016-02-14 NOTE — Progress Notes (Signed)
   Subjective:    Patient ID: Curtis Chan, male    DOB: January 15, 1926, 82 y.o.   MRN: [id number]  Pt presents to the office today to recheck HTN. PT's BP is at goal today.  Hypertension  This is a chronic problem. The current episode started more than 1 year ago. The problem has been resolved since onset. The problem is controlled. Associated symptoms include blurred vision. Pertinent negatives include no headaches, peripheral edema or shortness of breath. Risk factors for coronary artery disease include male gender, dyslipidemia and sedentary lifestyle. Past treatments include beta blockers and calcium channel blockers. The current treatment provides moderate improvement. Hypertensive end-organ damage includes kidney disease, CAD/MI and heart failure. There is no history of CVA.      Review of Systems  Constitutional: Negative.   HENT: Negative.   Eyes: Positive for blurred vision.  Respiratory: Negative.  Negative for shortness of breath.   Cardiovascular: Negative.   Gastrointestinal: Negative.   Endocrine: Negative.   Genitourinary: Negative.   Musculoskeletal: Negative.   Neurological: Negative.  Negative for headaches.  Hematological: Negative.   Psychiatric/Behavioral: Negative.   All other systems reviewed and are negative.      Objective:   Physical Exam  Constitutional: He is oriented to person, place, and time. He appears well-developed and well-nourished. No distress.  HENT:  Head: Normocephalic.  Eyes: Pupils are equal, round, and reactive to light. Right eye exhibits no discharge. Left eye exhibits no discharge.  Neck: Normal range of motion. Neck supple. No thyromegaly present.  Cardiovascular: Normal rate, regular rhythm and intact distal pulses.   Murmur heard. Pulmonary/Chest: Effort normal and breath sounds normal. No respiratory distress. He has no wheezes.  Diminished breath sounds   Abdominal: Soft. Bowel sounds are normal. He exhibits no distension. There  is no tenderness.  Musculoskeletal: Normal range of motion. He exhibits edema (2+ BLE). He exhibits no tenderness.  Neurological: He is alert and oriented to person, place, and time.  Skin: Skin is warm and dry. No rash noted. No erythema.  Psychiatric: He has a normal mood and affect. His behavior is normal. Judgment and thought content normal.  Vitals reviewed.     BP 125/62   Pulse 67   Temp 97 F (36.1 C) (Oral)   Ht '5\' 11"'$  (1.803 m)   Wt 204 lb (92.5 kg)   BMI 28.45 kg/m      Assessment & Plan:  1. Essential hypertension, benign -Dash diet information given -Exercise encouraged - Stress Management  -Continue current meds -RTO in 3 months - BMP8+EGFR  Evelina Dun, FNP

## 2016-02-14 NOTE — Patient Instructions (Signed)
Hypertension Hypertension, commonly called high blood pressure, is when the force of blood pumping through your arteries is too strong. Your arteries are the blood vessels that carry blood from your heart throughout your body. A blood pressure reading consists of a higher number over a lower number, such as 110/72. The higher number (systolic) is the pressure inside your arteries when your heart pumps. The lower number (diastolic) is the pressure inside your arteries when your heart relaxes. Ideally you want your blood pressure below 120/80. Hypertension forces your heart to work harder to pump blood. Your arteries may become narrow or stiff. Having untreated or uncontrolled hypertension can cause heart attack, stroke, kidney disease, and other problems. RISK FACTORS Some risk factors for high blood pressure are controllable. Others are not.  Risk factors you cannot control include:   Race. You may be at higher risk if you are African American.  Age. Risk increases with age.  Gender. Men are at higher risk than women before age 45 years. After age 65, women are at higher risk than men. Risk factors you can control include:  Not getting enough exercise or physical activity.  Being overweight.  Getting too much fat, sugar, calories, or salt in your diet.  Drinking too much alcohol. SIGNS AND SYMPTOMS Hypertension does not usually cause signs or symptoms. Extremely high blood pressure (hypertensive crisis) may cause headache, anxiety, shortness of breath, and nosebleed. DIAGNOSIS To check if you have hypertension, your health care provider will measure your blood pressure while you are seated, with your arm held at the level of your heart. It should be measured at least twice using the same arm. Certain conditions can cause a difference in blood pressure between your right and left arms. A blood pressure reading that is higher than normal on one occasion does not mean that you need treatment. If  it is not clear whether you have high blood pressure, you may be asked to return on a different day to have your blood pressure checked again. Or, you may be asked to monitor your blood pressure at home for 1 or more weeks. TREATMENT Treating high blood pressure includes making lifestyle changes and possibly taking medicine. Living a healthy lifestyle can help lower high blood pressure. You may need to change some of your habits. Lifestyle changes may include:  Following the DASH diet. This diet is high in fruits, vegetables, and whole grains. It is low in salt, red meat, and added sugars.  Keep your sodium intake below 2,300 mg per day.  Getting at least 30-45 minutes of aerobic exercise at least 4 times per week.  Losing weight if necessary.  Not smoking.  Limiting alcoholic beverages.  Learning ways to reduce stress. Your health care provider may prescribe medicine if lifestyle changes are not enough to get your blood pressure under control, and if one of the following is true:  You are 18-59 years of age and your systolic blood pressure is above 140.  You are 60 years of age or older, and your systolic blood pressure is above 150.  Your diastolic blood pressure is above 90.  You have diabetes, and your systolic blood pressure is over 140 or your diastolic blood pressure is over 90.  You have kidney disease and your blood pressure is above 140/90.  You have heart disease and your blood pressure is above 140/90. Your personal target blood pressure may vary depending on your medical conditions, your age, and other factors. HOME CARE INSTRUCTIONS    Have your blood pressure rechecked as directed by your health care provider.   Take medicines only as directed by your health care provider. Follow the directions carefully. Blood pressure medicines must be taken as prescribed. The medicine does not work as well when you skip doses. Skipping doses also puts you at risk for  problems.  Do not smoke.   Monitor your blood pressure at home as directed by your health care provider. SEEK MEDICAL CARE IF:   You think you are having a reaction to medicines taken.  You have recurrent headaches or feel dizzy.  You have swelling in your ankles.  You have trouble with your vision. SEEK IMMEDIATE MEDICAL CARE IF:  You develop a severe headache or confusion.  You have unusual weakness, numbness, or feel faint.  You have severe chest or abdominal pain.  You vomit repeatedly.  You have trouble breathing. MAKE SURE YOU:   Understand these instructions.  Will watch your condition.  Will get help right away if you are not doing well or get worse.   This information is not intended to replace advice given to you by your health care provider. Make sure you discuss any questions you have with your health care provider.   Document Released: 06/05/2005 Document Revised: 10/20/2014 Document Reviewed: 03/28/2013 Elsevier Interactive Patient Education 2016 [employer]  

## 2016-02-15 LAB — BMP8+EGFR
BUN / CREAT RATIO: 12 (ref 10–24)
BUN: 24 mg/dL (ref 8–27)
CHLORIDE: 103 mmol/L (ref 96–106)
CO2: 21 mmol/L (ref 18–29)
Calcium: 8.9 mg/dL (ref 8.6–10.2)
Creatinine, Ser: 1.99 mg/dL — ABNORMAL HIGH (ref 0.76–1.27)
GFR calc non Af Amer: 29 mL/min/{1.73_m2} — ABNORMAL LOW (ref >59)
GFR, EST AFRICAN AMERICAN: 34 mL/min/{1.73_m2} — AB (ref >59)
GLUCOSE: 104 mg/dL — AB (ref 65–99)
POTASSIUM: 4.5 mmol/L (ref 3.5–5.2)
SODIUM: 141 mmol/L (ref 134–144)

## 2016-02-29 ENCOUNTER — Other Ambulatory Visit: Payer: Self-pay | Admitting: Family

## 2016-03-28 ENCOUNTER — Other Ambulatory Visit: Payer: Self-pay | Admitting: Family

## 2016-03-28 DIAGNOSIS — I10 Essential (primary) hypertension: Secondary | ICD-10-CM

## 2016-03-29 ENCOUNTER — Other Ambulatory Visit: Payer: Self-pay | Admitting: Family

## 2016-03-29 DIAGNOSIS — I10 Essential (primary) hypertension: Secondary | ICD-10-CM

## 2016-04-17 ENCOUNTER — Other Ambulatory Visit: Payer: Self-pay | Admitting: Family

## 2016-05-16 ENCOUNTER — Other Ambulatory Visit: Payer: Self-pay | Admitting: Family

## 2016-05-24 ENCOUNTER — Other Ambulatory Visit: Payer: Self-pay | Admitting: Family

## 2016-05-25 NOTE — Telephone Encounter (Signed)
No vit. D level since 07/2014

## 2016-06-14 DIAGNOSIS — Z961 Presence of intraocular lens: Secondary | ICD-10-CM | POA: Diagnosis not present

## 2016-06-14 DIAGNOSIS — L209 Atopic dermatitis, unspecified: Secondary | ICD-10-CM | POA: Diagnosis not present

## 2016-06-15 ENCOUNTER — Ambulatory Visit (INDEPENDENT_AMBULATORY_CARE_PROVIDER_SITE_OTHER): Payer: Medicare Other | Admitting: Pediatrics

## 2016-06-15 ENCOUNTER — Encounter: Payer: Self-pay | Admitting: Pediatrics

## 2016-06-15 ENCOUNTER — Encounter (INDEPENDENT_AMBULATORY_CARE_PROVIDER_SITE_OTHER): Payer: Self-pay

## 2016-06-15 VITALS — BP 139/79 | HR 78 | Temp 97.2°F | Ht 71.0 in | Wt 218.0 lb

## 2016-06-15 DIAGNOSIS — E1165 Type 2 diabetes mellitus with hyperglycemia: Secondary | ICD-10-CM | POA: Diagnosis not present

## 2016-06-15 DIAGNOSIS — Z794 Long term (current) use of insulin: Secondary | ICD-10-CM

## 2016-06-15 DIAGNOSIS — H578 Other specified disorders of eye and adnexa: Secondary | ICD-10-CM

## 2016-06-15 DIAGNOSIS — E1142 Type 2 diabetes mellitus with diabetic polyneuropathy: Secondary | ICD-10-CM

## 2016-06-15 DIAGNOSIS — R7989 Other specified abnormal findings of blood chemistry: Secondary | ICD-10-CM

## 2016-06-15 DIAGNOSIS — R6889 Other general symptoms and signs: Secondary | ICD-10-CM

## 2016-06-15 DIAGNOSIS — I10 Essential (primary) hypertension: Secondary | ICD-10-CM

## 2016-06-15 DIAGNOSIS — E118 Type 2 diabetes mellitus with unspecified complications: Secondary | ICD-10-CM | POA: Diagnosis not present

## 2016-06-15 LAB — BAYER DCA HB A1C WAIVED: HB A1C (BAYER DCA - WAIVED): 6.7 % (ref <7.0)

## 2016-06-15 NOTE — Progress Notes (Signed)
  Subjective:   Patient ID: Curtis Chan, male    DOB: 08-Jun-1926, 82 y.o.   MRN: [id number] CC: Eyes Itching (3-4 days)  HPI: Curtis Chan is a 82 y.o. 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 Allergies and medications reviewed and updated. History  Smoking Status  . Former Smoker  . Packs/day: 0.25  . Years: 35.00  . Types: Cigarettes  . Quit date: 04/25/1983  Smokeless Tobacco  . Never Used   ROS: Per HPI   Objective:    BP 139/79   Pulse 78   Temp 97.2 F (36.2 C) (Oral)   Ht '5\' 11"'$  (1.803 m)   Wt 218 lb (98.9 kg)   BMI 30.40 kg/m   Wt Readings from Last 3 Encounters:  06/15/16 218 lb (98.9 kg)  02/14/16 204 lb (92.5 kg)  01/31/16 205 lb 6.4 oz (93.2 kg)    Gen: NAD, alert, cooperative with exam, NCAT EYES: EOMI, no conjunctival injection, or no icterus. Slightly red skin above eyes to eye brow and below eyes to zygomatic arch. No tearing, no discharge from eyes ENT:  TMs dull gray b/l, OP without erythema LYMPH: no cervical LAD CV: NRRR, normal S1/S2, no carotid bruits Resp: CTABL, no wheezes, normal WOB Abd: +BS, soft, NTND. no guarding or organomegaly Ext: trace pitting edema b/l, warm Neuro: Alert and oriented MSK: normal muscle bulk  Assessment & Plan:  Curtis Chan was seen today for eyes itching.  Diagnoses and all orders for this visit:  Essential hypertension, benign Adequate control, cont bystolic -     KVT5+LEZV -     CBC  with Differential/Platelet   type 2 diabetes mellitus with complication, with long-term current use of insulin (HCC) A1c 6.7, cont current meds -     Bayer DCA Hb A1c Waived  Elevated serum creatinine Recheck Cr  Itchy eyes Cont ointment from eye doctor No conjunctival injection  Follow up plan: prn Curtis Found, MD [place]

## 2016-06-16 LAB — CBC WITH DIFFERENTIAL/PLATELET
BASOS: 0 %
Basophils Absolute: 0 10*3/uL (ref 0.0–0.2)
EOS (ABSOLUTE): 1 10*3/uL — ABNORMAL HIGH (ref 0.0–0.4)
Eos: 10 %
HEMATOCRIT: 35.7 % — AB (ref 37.5–51.0)
Hemoglobin: 11.8 g/dL — ABNORMAL LOW (ref 13.0–17.7)
IMMATURE GRANULOCYTES: 0 %
Immature Grans (Abs): 0 10*3/uL (ref 0.0–0.1)
LYMPHS ABS: 2.2 10*3/uL (ref 0.7–3.1)
Lymphs: 23 %
MCH: 29.8 pg (ref 26.6–33.0)
MCHC: 33.1 g/dL (ref 31.5–35.7)
MCV: 90 fL (ref 79–97)
MONOCYTES: 6 %
Monocytes Absolute: 0.5 10*3/uL (ref 0.1–0.9)
Neutrophils Absolute: 5.7 10*3/uL (ref 1.4–7.0)
Neutrophils: 61 %
PLATELETS: 184 10*3/uL (ref 150–379)
RBC: 3.96 x10E6/uL — AB (ref 4.14–5.80)
RDW: 13.7 % (ref 12.3–15.4)
WBC: 9.5 10*3/uL (ref 3.4–10.8)

## 2016-06-16 LAB — BMP8+EGFR
BUN / CREAT RATIO: 12 (ref 10–24)
BUN: 22 mg/dL (ref 8–27)
CO2: 25 mmol/L (ref 18–29)
CREATININE: 1.91 mg/dL — AB (ref 0.76–1.27)
Calcium: 9.5 mg/dL (ref 8.6–10.2)
Chloride: 105 mmol/L (ref 96–106)
GFR calc Af Amer: 35 mL/min/{1.73_m2} — ABNORMAL LOW (ref >59)
GFR, EST NON AFRICAN AMERICAN: 31 mL/min/{1.73_m2} — AB (ref >59)
GLUCOSE: 158 mg/dL — AB (ref 65–99)
Potassium: 4.7 mmol/L (ref 3.5–5.2)
SODIUM: 144 mmol/L (ref 134–144)

## 2016-07-10 ENCOUNTER — Encounter: Payer: Self-pay | Admitting: Family Medicine

## 2016-07-10 DIAGNOSIS — H3563 Retinal hemorrhage, bilateral: Secondary | ICD-10-CM | POA: Diagnosis not present

## 2016-07-10 DIAGNOSIS — E119 Type 2 diabetes mellitus without complications: Secondary | ICD-10-CM | POA: Diagnosis not present

## 2016-07-10 DIAGNOSIS — H31002 Unspecified chorioretinal scars, left eye: Secondary | ICD-10-CM | POA: Diagnosis not present

## 2016-07-10 DIAGNOSIS — Z794 Long term (current) use of insulin: Secondary | ICD-10-CM | POA: Diagnosis not present

## 2016-07-10 LAB — HM DIABETES EYE EXAM

## 2016-07-17 ENCOUNTER — Other Ambulatory Visit: Payer: Self-pay | Admitting: Family

## 2016-07-17 ENCOUNTER — Encounter: Payer: Self-pay | Admitting: Family Medicine

## 2016-07-17 ENCOUNTER — Ambulatory Visit (INDEPENDENT_AMBULATORY_CARE_PROVIDER_SITE_OTHER): Payer: Medicare Other | Admitting: Family Medicine

## 2016-07-17 VITALS — BP 209/92 | HR 97 | Temp 97.6°F | Ht 71.0 in | Wt 216.2 lb

## 2016-07-17 DIAGNOSIS — I10 Essential (primary) hypertension: Secondary | ICD-10-CM | POA: Diagnosis not present

## 2016-07-17 DIAGNOSIS — H539 Unspecified visual disturbance: Secondary | ICD-10-CM

## 2016-07-17 DIAGNOSIS — L209 Atopic dermatitis, unspecified: Secondary | ICD-10-CM | POA: Diagnosis not present

## 2016-07-17 MED ORDER — PREDNISONE 20 MG PO TABS: ORAL_TABLET | ORAL | 0 refills | Status: DC

## 2016-07-17 MED ORDER — TRIAMCINOLONE ACETONIDE 0.1 % EX CREA: 1.0000 "application " | TOPICAL_CREAM | Freq: Two times a day (BID) | CUTANEOUS | 0 refills | Status: DC

## 2016-07-17 NOTE — Progress Notes (Signed)
BP (!) 209/92   Pulse 97   Temp 97.6 F (36.4 C) (Oral)   Ht 5\' 11"  (1.803 m)   Wt 216 lb 3.2 oz (98.1 kg)   BMI 30.15 kg/m    Subjective:    Patient ID: Curtis Chan, male    DOB: 08-05-1925, 83 y.o.   MRN: [id number]  HPI: Curtis Chan is a 83 y.o. male presenting on 07/17/2016 for Pruritis (Patient states it has been going on for 2 weeks daily. Patient states the eye doctor told him to get an [place] of his neck due to bleeding behind eyes.)   HPI Hypertension and visual disturbance Patient comes in and his blood pressures very elevated today. He has been having a visual disturbance that he seen ophthalmology for and they recommended that he go get a carotid ultrasound because of hemorrhage in the retina of his eye. He says the visual disturbance has not changed acutely and he denies any chest pain or shortness of breath today. He is having a very pruritic rash going on that may be irritating him enough to raise his blood pressure slightly. He says the has taken his medications.  Rash and pruritus Patient has been having a rash and pruritus is been going on for 2 weeks almost daily is mostly involving his torso and arms. The pruritus came first and then he is excoriated and gotten some rash but mostly just feels pruritic all over his upper extremities. His arms are worse than his torso at this point. He denies any fevers or chills or erythema or warmth or drainage.  Visual disturbance/ophthalmology follow-up Patient saw an ophthalmologist for visual disturbance was told that he had hemorrhages in the back of his eyes and told that he needed a carotid ultrasound to make sure that there were no blockages. He says his visual disturbance is stable from that time and he saw the ophthalmologist a few weeks ago.  Relevant past medical, surgical, family and social history reviewed and updated as indicated. Interim medical history since our last visit reviewed. Allergies and medications  reviewed and updated.  Review of Systems  Constitutional: Negative for chills and fever.  Eyes: Positive for visual disturbance. Negative for discharge.  Respiratory: Negative for shortness of breath and wheezing.   Cardiovascular: Negative for chest pain and leg swelling.  Musculoskeletal: Negative for back pain and gait problem.  Skin: Positive for rash.  Neurological: Negative for dizziness, weakness, light-headedness, numbness and headaches.  All other systems reviewed and are negative.   Per HPI unless specifically indicated above     Objective:    BP (!) 209/92   Pulse 97   Temp 97.6 F (36.4 C) (Oral)   Ht 5\' 11"  (1.803 m)   Wt 216 lb 3.2 oz (98.1 kg)   BMI 30.15 kg/m   Wt Readings from Last 3 Encounters:  07/17/16 216 lb 3.2 oz (98.1 kg)  06/15/16 218 lb (98.9 kg)  02/14/16 204 lb (92.5 kg)    Physical Exam  Constitutional: He is oriented to person, place, and time. He appears well-developed and well-nourished. No distress.  Eyes: Conjunctivae are normal. Right eye exhibits no discharge. Left eye exhibits no discharge. No scleral icterus.  Cardiovascular: Normal rate, regular rhythm, normal heart sounds and intact distal pulses.   No murmur heard. Pulmonary/Chest: Effort normal and breath sounds normal. No respiratory distress. He has no wheezes.  Musculoskeletal: Normal range of motion. He exhibits no edema.  Neurological: He is alert  and oriented to person, place, and time. Coordination normal.  Skin: Skin is warm and dry. Rash (Rash is mostly excoriations on upper 70s.) noted. He is not diaphoretic.  Psychiatric: He has a normal mood and affect. His behavior is normal.  Nursing note and vitals reviewed.     Assessment & Plan:   Problem List Items Addressed This Visit      Cardiovascular and Mediastinum   Essential hypertension, benign - Primary   Relevant Orders   US Carotid Duplex Bilateral    Other Visit Diagnoses    Visual disturbance       Saw  ophthalmology and they recommended carotid ultrasound for hemorrhagic spots in retina   Relevant Orders   US Carotid Duplex Bilateral   Atopic dermatitis, unspecified type       Relevant Medications   predniSONE (DELTASONE) 20 MG tablet   triamcinolone cream (KENALOG) 0.1 %       Follow up plan: Return in about 2 weeks (around 07/31/2016), or if symptoms worsen or fail to improve, for Hypertension recheck.  Counseling provided for all of the vaccine components Orders Placed This Encounter  Procedures  . US Carotid Duplex Bilateral    Caryl Pina, MD [facility] 07/17/2016, 6:23 PM

## 2016-08-14 ENCOUNTER — Other Ambulatory Visit: Payer: Self-pay | Admitting: Family

## 2016-10-10 ENCOUNTER — Telehealth: Payer: Self-pay

## 2016-10-10 NOTE — Telephone Encounter (Signed)
Pt refused procedure

## 2016-11-03 ENCOUNTER — Other Ambulatory Visit: Payer: Self-pay

## 2016-11-03 DIAGNOSIS — H3563 Retinal hemorrhage, bilateral: Secondary | ICD-10-CM | POA: Diagnosis not present

## 2016-11-03 DIAGNOSIS — H11153 Pinguecula, bilateral: Secondary | ICD-10-CM | POA: Diagnosis not present

## 2016-11-03 DIAGNOSIS — E119 Type 2 diabetes mellitus without complications: Secondary | ICD-10-CM | POA: Diagnosis not present

## 2016-11-03 DIAGNOSIS — H31002 Unspecified chorioretinal scars, left eye: Secondary | ICD-10-CM | POA: Diagnosis not present

## 2016-11-03 MED ORDER — PEN NEEDLES 31G X 6 MM MISC: 1 refills | Status: DC

## 2016-11-07 ENCOUNTER — Ambulatory Visit (HOSPITAL_COMMUNITY)
Admission: RE | Admit: 2016-11-07 | Discharge: 2016-11-07 | Disposition: A | Discharge status: Home / Self Care | Payer: Medicare Other | Source: Ambulatory Visit | Attending: Family | Admitting: Family

## 2016-11-07 DIAGNOSIS — H3582 Retinal ischemia: Secondary | ICD-10-CM | POA: Insufficient documentation

## 2016-11-07 DIAGNOSIS — I63031 Cerebral infarction due to thrombosis of right carotid artery: Secondary | ICD-10-CM | POA: Diagnosis not present

## 2016-11-07 DIAGNOSIS — I6523 Occlusion and stenosis of bilateral carotid arteries: Secondary | ICD-10-CM | POA: Insufficient documentation

## 2016-11-10 ENCOUNTER — Other Ambulatory Visit: Payer: Self-pay | Admitting: *Deleted

## 2016-11-28 ENCOUNTER — Telehealth: Payer: Self-pay | Admitting: Family

## 2016-11-28 NOTE — Telephone Encounter (Signed)
Caller changed her mind about leaving a message.

## 2017-01-09 ENCOUNTER — Other Ambulatory Visit: Payer: Self-pay | Admitting: Family

## 2017-01-09 ENCOUNTER — Other Ambulatory Visit: Payer: Self-pay | Admitting: Family Medicine

## 2017-01-09 DIAGNOSIS — L209 Atopic dermatitis, unspecified: Secondary | ICD-10-CM

## 2017-01-10 NOTE — Telephone Encounter (Signed)
Appt tomorrow 01/11/17

## 2017-01-11 ENCOUNTER — Encounter: Payer: Self-pay | Admitting: Family Medicine

## 2017-01-11 ENCOUNTER — Ambulatory Visit (INDEPENDENT_AMBULATORY_CARE_PROVIDER_SITE_OTHER): Payer: Medicare Other | Admitting: Family Medicine

## 2017-01-11 VITALS — BP 160/86 | HR 97 | Temp 97.2°F | Ht 71.0 in | Wt 203.0 lb

## 2017-01-11 DIAGNOSIS — I10 Essential (primary) hypertension: Secondary | ICD-10-CM

## 2017-01-11 DIAGNOSIS — L97411 Non-pressure chronic ulcer of right heel and midfoot limited to breakdown of skin: Secondary | ICD-10-CM | POA: Diagnosis not present

## 2017-01-11 DIAGNOSIS — E11621 Type 2 diabetes mellitus with foot ulcer: Secondary | ICD-10-CM | POA: Diagnosis not present

## 2017-01-11 DIAGNOSIS — R410 Disorientation, unspecified: Secondary | ICD-10-CM

## 2017-01-11 MED ORDER — HYDRALAZINE HCL 25 MG PO TABS: 25.0000 mg | ORAL_TABLET | Freq: Three times a day (TID) | ORAL | 2 refills | Status: DC

## 2017-01-11 MED ORDER — SULFAMETHOXAZOLE-TRIMETHOPRIM 800-160 MG PO TABS: 1.0000 | ORAL_TABLET | Freq: Two times a day (BID) | ORAL | 0 refills | Status: DC

## 2017-01-11 MED ORDER — CLOBETASOL PROPIONATE 0.05 % EX LIQD: Freq: Two times a day (BID) | CUTANEOUS | 11 refills | Status: DC

## 2017-01-11 NOTE — Telephone Encounter (Signed)
Last seen 07/17/16  Dr Dettinger

## 2017-01-11 NOTE — Progress Notes (Signed)
BP (!) 160/86   Pulse 97   Temp (!) 97.2 F (36.2 C) (Oral)   Ht 5\' 11"  (1.803 m)   Wt 203 lb (92.1 kg)   BMI 28.31 kg/m    Subjective:    Patient ID: Curtis Chan, male    DOB: [birth year], 83 y.o.   MRN: [id number]  HPI: Curtis Chan is a 83 y.o. male presenting on 01/11/2017 for Recent fatigue, weakness, weight loss, staying cold (daughter would like him to have labwork); Sore on right foot (tried to cut off bunion and went too deep with the knife); and Discuss amlodipine (swells when he takes med, swelling goes down if he discontinues)   HPI Hypertension Patient is currently on Bystolic and amlodipine, and their blood pressure today is 160/86. Patient denies any lightheadedness or dizziness. Patient denies headaches, blurred vision, chest pains, shortness of breath, or weakness. Denies any side effects from medication and is content with current medication. Patient does complain of leg swelling and thinks is due to the amlodipine and would like to stop it. I told him that we will have to likely transition him to something else before stopping it abruptly especially since his blood pressure is already elevated while he is on it. Patient does have some issues with renal dysfunction and we are limited because of that  Type 2 diabetes mellitus Patient comes in today for recheck of his diabetes. Patient has been currently taking Levemir 16 units daily. Patient is not currently on an ACE inhibitor/ARB because of renal disorder. Patient has not seen an ophthalmologist this year. Patient attempted to cut off a callus off the bottom of his foot on the lateral aspect and cut too deep and has a sore there that he wants [place] to look at. He says it has been slightly tender and has not been draining and there is no redness or warmth.   Confusion Patient's daughter brings him in saying that he has been more fatigued lately and having generalized weakness more than normal and been acting more  confused over the past week or 2. His daughter's been concerned that maybe something of his medications could be causing it or he might be developing a urinary tract infection and is bringing him in today because she just feels like he has been worsening than what he has been previously. She says sometimes he talks and funny words as though he is talking about things in the past or things that don't make sense.  Relevant past medical, surgical, family and social history reviewed and updated as indicated. Interim medical history since our last visit reviewed. Allergies and medications reviewed and updated.  Review of Systems  Constitutional: Positive for fatigue. Negative for chills and fever.  Eyes: Negative for discharge.  Respiratory: Negative for shortness of breath and wheezing.   Cardiovascular: Negative for chest pain and leg swelling.  Musculoskeletal: Negative for back pain and gait problem.  Skin: Positive for wound. Negative for rash.  Neurological: Positive for weakness. Negative for dizziness, speech difficulty, light-headedness, numbness and headaches.  Psychiatric/Behavioral: Positive for confusion and decreased concentration. Negative for self-injury, sleep disturbance and suicidal ideas.  All other systems reviewed and are negative.   Per HPI unless specifically indicated above   Allergies as of 01/11/2017   No Known Allergies     Medication List       Accurate as of 01/11/17  5:17 PM. Always use your most recent med list.  amLODipine 10 MG tablet Commonly known as:  NORVASC TAKE 1 TABLET (10 MG TOTAL) BY MOUTH DAILY.   aspirin 81 MG tablet Take 81 mg by mouth daily.   BYSTOLIC 5 MG tablet Generic drug:  nebivolol TAKE 1 TABLET (5 MG TOTAL) BY MOUTH DAILY.   glucose blood test strip Commonly known as:  FREESTYLE LITE Use to check blood glucose twice a day.  Dx:  250.03   hydrALAZINE 25 MG tablet Commonly known as:  APRESOLINE Take 1 tablet (25 mg  total) by mouth 3 (three) times daily.   LEVEMIR FLEXTOUCH 100 UNIT/ML Pen Generic drug:  Insulin Detemir INJECT 16 UNITS INTO THE SKIN ONCE A DAY   predniSONE 20 MG tablet Commonly known as:  DELTASONE 1 po at same time daily for 5 days   tamsulosin 0.4 MG Caps capsule Commonly known as:  FLOMAX TAKE 2 CAPSULES (0.8 MG TOTAL) BY MOUTH DAILY.   triamcinolone cream 0.1 % Commonly known as:  KENALOG Apply 1 application topically 2 (two) times daily.   ULTICARE MINI PEN NEEDLES 31G X 6 MM Misc Generic drug:  Insulin Pen Needle USE WITH INSULIN PEN ONCE A DAY.   Vitamin D (Ergocalciferol) 50000 units Caps capsule Commonly known as:  DRISDOL TAKE 1 CAPSULE (50,000 UNITS TOTAL) BY MOUTH EVERY 7 (SEVEN) DAYS.          Objective:    BP (!) 160/86   Pulse 97   Temp (!) 97.2 F (36.2 C) (Oral)   Ht 5\' 11"  (1.803 m)   Wt 203 lb (92.1 kg)   BMI 28.31 kg/m   Wt Readings from Last 3 Encounters:  01/11/17 203 lb (92.1 kg)  07/17/16 216 lb 3.2 oz (98.1 kg)  06/15/16 218 lb (98.9 kg)    Physical Exam  Constitutional: He is oriented to person, place, and time. He appears well-developed and well-nourished. No distress.  Eyes: Conjunctivae are normal. No scleral icterus.  Neck: Neck supple. No thyromegaly present.  Cardiovascular: Normal rate, regular rhythm, normal heart sounds and intact distal pulses.   No murmur heard. Pulmonary/Chest: Effort normal and breath sounds normal. No respiratory distress. He has no wheezes. He has no rales.  Musculoskeletal: Normal range of motion. He exhibits no edema.  Lymphadenopathy:    He has no cervical adenopathy.  Neurological: He is alert and oriented to person, place, and time. Coordination normal.  Skin: Skin is warm and dry. Laceration (0.25 x 0.5 ulceration that 0.25 deep on his right foot on the lateral sole of his pad.) noted. No rash noted. He is not diaphoretic.  Psychiatric: He has a normal mood and affect. His behavior is normal.  Cognition and memory are impaired. He expresses no suicidal ideation. He expresses no suicidal plans.  Nursing note and vitals reviewed.   Urinalysis: Trace blood, 2+ protein, uro 2, negative nitrate    Assessment & Plan:   Problem List Items Addressed This Visit      Cardiovascular and Mediastinum   Essential hypertension, benign   Relevant Medications   hydrALAZINE (APRESOLINE) 25 MG tablet    Other Visit Diagnoses    Confusion    -  Primary   Possible UTI on dip versus depression versus vascular dementia with diabetes   Relevant Orders   Urinalysis (Completed)   Diabetic ulcer of right midfoot associated with type 2 diabetes mellitus, limited to breakdown of skin [facility])       Relevant Orders   Ambulatory referral to Podiatry  Follow up plan: Return if symptoms worsen or fail to improve.  Counseling provided for all of the vaccine components Orders Placed This Encounter  Procedures  . Urinalysis    Caryl Pina, MD [facility] 01/11/2017, 5:17 PM

## 2017-01-12 LAB — URINALYSIS
BILIRUBIN UA: NEGATIVE
GLUCOSE, UA: NEGATIVE
Ketones, UA: NEGATIVE
LEUKOCYTES UA: NEGATIVE
Nitrite, UA: NEGATIVE
SPEC GRAV UA: 1.02 (ref 1.005–1.030)
Urobilinogen, Ur: 2 mg/dL — ABNORMAL HIGH (ref 0.2–1.0)
pH, UA: 7 (ref 5.0–7.5)

## 2017-01-16 ENCOUNTER — Other Ambulatory Visit: Payer: Self-pay | Admitting: Family

## 2017-01-16 DIAGNOSIS — L97511 Non-pressure chronic ulcer of other part of right foot limited to breakdown of skin: Secondary | ICD-10-CM | POA: Diagnosis not present

## 2017-01-16 DIAGNOSIS — E1151 Type 2 diabetes mellitus with diabetic peripheral angiopathy without gangrene: Secondary | ICD-10-CM | POA: Diagnosis not present

## 2017-01-18 DIAGNOSIS — L97511 Non-pressure chronic ulcer of other part of right foot limited to breakdown of skin: Secondary | ICD-10-CM | POA: Diagnosis not present

## 2017-01-18 DIAGNOSIS — L97501 Non-pressure chronic ulcer of other part of unspecified foot limited to breakdown of skin: Secondary | ICD-10-CM | POA: Diagnosis not present

## 2017-01-18 DIAGNOSIS — E11621 Type 2 diabetes mellitus with foot ulcer: Secondary | ICD-10-CM | POA: Diagnosis not present

## 2017-01-18 DIAGNOSIS — E1151 Type 2 diabetes mellitus with diabetic peripheral angiopathy without gangrene: Secondary | ICD-10-CM | POA: Diagnosis not present

## 2017-01-23 DIAGNOSIS — L97511 Non-pressure chronic ulcer of other part of right foot limited to breakdown of skin: Secondary | ICD-10-CM | POA: Diagnosis not present

## 2017-02-06 DIAGNOSIS — L97511 Non-pressure chronic ulcer of other part of right foot limited to breakdown of skin: Secondary | ICD-10-CM | POA: Diagnosis not present

## 2017-02-09 DIAGNOSIS — Z029 Encounter for administrative examinations, unspecified: Secondary | ICD-10-CM

## 2017-02-20 DIAGNOSIS — L97511 Non-pressure chronic ulcer of other part of right foot limited to breakdown of skin: Secondary | ICD-10-CM | POA: Diagnosis not present

## 2017-04-02 ENCOUNTER — Other Ambulatory Visit: Payer: Self-pay | Admitting: Family

## 2017-04-04 ENCOUNTER — Ambulatory Visit (INDEPENDENT_AMBULATORY_CARE_PROVIDER_SITE_OTHER): Payer: Medicare Other

## 2017-04-04 ENCOUNTER — Other Ambulatory Visit: Payer: Self-pay | Admitting: Family

## 2017-04-04 DIAGNOSIS — Z23 Encounter for immunization: Secondary | ICD-10-CM | POA: Diagnosis not present

## 2017-04-05 ENCOUNTER — Other Ambulatory Visit: Payer: Self-pay | Admitting: Family Medicine

## 2017-04-05 ENCOUNTER — Encounter: Payer: Self-pay | Admitting: Family

## 2017-04-05 ENCOUNTER — Ambulatory Visit (INDEPENDENT_AMBULATORY_CARE_PROVIDER_SITE_OTHER): Payer: Medicare Other | Admitting: Family

## 2017-04-05 VITALS — BP 190/94 | HR 92 | Temp 97.4°F | Ht 71.0 in | Wt 195.0 lb

## 2017-04-05 DIAGNOSIS — I7025 Atherosclerosis of native arteries of other extremities with ulceration: Secondary | ICD-10-CM | POA: Diagnosis not present

## 2017-04-05 DIAGNOSIS — R7989 Other specified abnormal findings of blood chemistry: Secondary | ICD-10-CM

## 2017-04-05 DIAGNOSIS — Z9119 Patient's noncompliance with other medical treatment and regimen: Secondary | ICD-10-CM

## 2017-04-05 DIAGNOSIS — I739 Peripheral vascular disease, unspecified: Secondary | ICD-10-CM

## 2017-04-05 DIAGNOSIS — E118 Type 2 diabetes mellitus with unspecified complications: Secondary | ICD-10-CM

## 2017-04-05 DIAGNOSIS — N400 Enlarged prostate without lower urinary tract symptoms: Secondary | ICD-10-CM

## 2017-04-05 DIAGNOSIS — E1165 Type 2 diabetes mellitus with hyperglycemia: Secondary | ICD-10-CM

## 2017-04-05 DIAGNOSIS — IMO0002 Reserved for concepts with insufficient information to code with codable children: Secondary | ICD-10-CM

## 2017-04-05 DIAGNOSIS — Z91199 Patient's noncompliance with other medical treatment and regimen due to unspecified reason: Secondary | ICD-10-CM

## 2017-04-05 DIAGNOSIS — L97519 Non-pressure chronic ulcer of other part of right foot with unspecified severity: Secondary | ICD-10-CM

## 2017-04-05 DIAGNOSIS — E11621 Type 2 diabetes mellitus with foot ulcer: Secondary | ICD-10-CM | POA: Diagnosis not present

## 2017-04-05 DIAGNOSIS — I10 Essential (primary) hypertension: Secondary | ICD-10-CM

## 2017-04-05 LAB — BAYER DCA HB A1C WAIVED: HB A1C (BAYER DCA - WAIVED): 6.2 % (ref <7.0)

## 2017-04-05 MED ORDER — AMLODIPINE BESYLATE 10 MG PO TABS
ORAL_TABLET | ORAL | 1 refills | Status: DC
Start: 2017-04-05 — End: 2017-04-11

## 2017-04-05 MED ORDER — NEBIVOLOL HCL 5 MG PO TABS: 5.0000 mg | ORAL_TABLET | Freq: Every day | ORAL | 1 refills | Status: DC

## 2017-04-05 NOTE — Patient Instructions (Signed)

## 2017-04-05 NOTE — Progress Notes (Signed)
Subjective:    Patient ID: Curtis Chan, male    DOB: April 14, 1926, 83 y.o.   MRN: [id number]  Pt presents to the office today for chronic follow up. Pt has elevated serum creatinine, but does not want to see nephrologists. Pt is noncompliant with his diabetic medications. Pt has PVD and CAD but has been years since following Cardiologists .   Pt has not taken any medications except his Levemir. PT's BP is very high. States his medications do not work.  Diabetes  He presents for his follow-up diabetic visit. He has type 2 diabetes mellitus. His disease course has been worsening. There are no hypoglycemic associated symptoms. Associated symptoms include foot paresthesias and visual change. Pertinent negatives for diabetes include no blurred vision. There are no hypoglycemic complications. Symptoms are worsening. Diabetic complications include a CVA, heart disease and nephropathy. Risk factors for coronary artery disease include diabetes mellitus, dyslipidemia, family history, hypertension, male sex, obesity and stress. He is following a generally unhealthy diet. (Does not check BS)  Hypertension  This is a chronic problem. The current episode started more than 1 year ago. The problem has been waxing and waning since onset. The problem is uncontrolled. Pertinent negatives include no blurred vision, malaise/fatigue, peripheral edema or shortness of breath. Risk factors for coronary artery disease include diabetes mellitus, dyslipidemia, male gender, sedentary lifestyle and family history. Past treatments include nothing. The current treatment provides no improvement. Hypertensive end-organ damage includes kidney disease, CAD/MI and CVA.  Benign Prostatic Hypertrophy  This is a chronic problem. The current episode started more than 1 year ago. Irritative symptoms include nocturia (2). Obstructive symptoms do not include straining. Pertinent negatives include no dysuria, hematuria or nausea.  Diabatic Foot  Ulcer Pt states he "cut" a bunion with pocket knife off of the ball of his right foot. States he saw Podiatry for "awhile", but wasn't doing anything. States it is stable.      Review of Systems  Constitutional: Negative for malaise/fatigue.  Eyes: Negative for blurred vision.  Respiratory: Negative for shortness of breath.   Gastrointestinal: Negative for nausea.  Genitourinary: Positive for nocturia (2). Negative for dysuria and hematuria.  All other systems reviewed and are negative.      Objective:   Physical Exam  Constitutional: He is oriented to person, place, and time. He appears well-developed and well-nourished. No distress.  HENT:  Head: Normocephalic.  Right Ear: External ear normal.  Left Ear: External ear normal.  Nose: Nose normal.  Mouth/Throat: Oropharynx is clear and moist.  Eyes: Pupils are equal, round, and reactive to light. Right eye exhibits no discharge. Left eye exhibits no discharge.  Neck: Normal range of motion. Neck supple. No thyromegaly present.  Cardiovascular: Normal rate, regular rhythm, normal heart sounds and intact distal pulses.   No murmur heard. Pulmonary/Chest: Effort normal and breath sounds normal. No respiratory distress. He has no wheezes.  Abdominal: Soft. Bowel sounds are normal. He exhibits no distension. There is no tenderness.  Musculoskeletal: Normal range of motion. He exhibits edema (1+ BLE). He exhibits no tenderness.  Neurological: He is alert and oriented to person, place, and time.  Skin: Skin is warm and dry. No rash noted. No erythema.  Ulcer on right lateral foot  Psychiatric: He has a normal mood and affect. His behavior is normal. Judgment and thought content normal.  Vitals reviewed.    BP (!) 190/94   Pulse 92   Temp (!) 97.4 F (36.3 C) (Oral)  Ht _0  (1.803 m)   Wt 195 lb (88.5 kg)   BMI 27.20 kg/m      Assessment & Plan:  1. Diabetes mellitus type 2 with complications, uncontrolled (HCC) - [facility]ayer  DCA Hb A1c Waived - CMP14+EGFR - Microalbumin / creatinine urine ratio  2. Essential hypertension, benign - CMP14+EGFR - amLODipine (NORVASC) 10 MG tablet; TAKE 1 TABLET (10 MG TOTAL) BY MOUTH DAILY.  Dispense: 90 tablet; Refill: 1 - nebivolol (BYSTOLIC) 5 MG tablet; Take 1 tablet (5 mg total) by mouth daily.  Dispense: 90 tablet; Refill: 1  3. PVD (peripheral vascular disease) (HCC) - CMP14+EGFR  4. Elevated serum creatinine - CMP14+EGFR  5. Atherosclerosis of native arteries of the extremities with ulceration (HCC) - CMP14+EGFR - Lipid panel  6. Benign prostatic hyperplasia without lower urinary tract symptoms  - CMP14+EGFR  7. Noncompliance - CMP14+EGFR  8. Diabetic ulcer of right foot associated with type 2 diabetes mellitus, unspecified part of foot, unspecified ulcer stage ([place] Do not pick at feet!!!! - CMP14+EGFR  Long discussion with patient. Pt wants to stop his insulin. We will restart his Bystolic and Amlodipine today. Will get him to follow up in 1 week to recheck BP. Low salt diet.   Continue all meds Labs pending Health Maintenance reviewed Diet and exercise encouraged RTO 1 week   Evelina Dun, FNP

## 2017-04-06 ENCOUNTER — Other Ambulatory Visit: Payer: Self-pay | Admitting: *Deleted

## 2017-04-06 DIAGNOSIS — E875 Hyperkalemia: Secondary | ICD-10-CM

## 2017-04-06 LAB — LIPID PANEL
Chol/HDL Ratio: 4.1 ratio (ref 0.0–5.0)
Cholesterol, Total: 189 mg/dL (ref 100–199)
HDL: 46 mg/dL (ref >39)
LDL Calculated: 128 mg/dL — ABNORMAL HIGH (ref 0–99)
Triglycerides: 73 mg/dL (ref 0–149)
VLDL Cholesterol Cal: 15 mg/dL (ref 5–40)

## 2017-04-06 LAB — CMP14+EGFR
A/G RATIO: 1.2 (ref 1.2–2.2)
ALT: 9 IU/L (ref 0–44)
AST: 24 IU/L (ref 0–40)
Albumin: 4.2 g/dL (ref 3.5–4.7)
Alkaline Phosphatase: 94 IU/L (ref 39–117)
BILIRUBIN TOTAL: 1.3 mg/dL — AB (ref 0.0–1.2)
BUN / CREAT RATIO: 11 (ref 10–24)
BUN: 21 mg/dL (ref 8–27)
CHLORIDE: 106 mmol/L (ref 96–106)
CO2: 19 mmol/L — ABNORMAL LOW (ref 20–29)
Calcium: 9.3 mg/dL (ref 8.6–10.2)
Creatinine, Ser: 1.88 mg/dL — ABNORMAL HIGH (ref 0.76–1.27)
GFR calc non Af Amer: 31 mL/min/{1.73_m2} — ABNORMAL LOW (ref >59)
GFR, EST AFRICAN AMERICAN: 36 mL/min/{1.73_m2} — AB (ref >59)
GLOBULIN, TOTAL: 3.4 g/dL (ref 1.5–4.5)
Glucose: 94 mg/dL (ref 65–99)
POTASSIUM: 5.6 mmol/L — AB (ref 3.5–5.2)
SODIUM: 141 mmol/L (ref 134–144)
TOTAL PROTEIN: 7.6 g/dL (ref 6.0–8.5)

## 2017-04-06 LAB — MICROALBUMIN / CREATININE URINE RATIO
CREATININE, UR: 161.8 mg/dL
MICROALB/CREAT RATIO: 396.2 mg/g{creat} — AB (ref 0.0–30.0)
MICROALBUM., U, RANDOM: 641.1 ug/mL

## 2017-04-11 ENCOUNTER — Encounter: Payer: Self-pay | Admitting: Family

## 2017-04-11 ENCOUNTER — Ambulatory Visit (INDEPENDENT_AMBULATORY_CARE_PROVIDER_SITE_OTHER): Payer: Medicare Other | Admitting: Family

## 2017-04-11 VITALS — BP 172/72 | HR 85 | Temp 97.0°F | Ht 71.0 in | Wt 197.0 lb

## 2017-04-11 DIAGNOSIS — I10 Essential (primary) hypertension: Secondary | ICD-10-CM | POA: Diagnosis not present

## 2017-04-11 DIAGNOSIS — Z9119 Patient's noncompliance with other medical treatment and regimen: Secondary | ICD-10-CM

## 2017-04-11 DIAGNOSIS — Z91199 Patient's noncompliance with other medical treatment and regimen due to unspecified reason: Secondary | ICD-10-CM

## 2017-04-11 MED ORDER — INSULIN DETEMIR 100 UNIT/ML FLEXPEN: 10.0000 [IU] | PEN_INJECTOR | Freq: Every day | SUBCUTANEOUS | 3 refills | Status: DC

## 2017-04-11 NOTE — Progress Notes (Signed)
   Subjective:    Patient ID: Curtis Chan, male    DOB: Dec 08, 1925, 83 y.o.   MRN: [id number]  Pt presents to the office today recheck BP. PT states he went to pick up his amlodipine and Bystolic, but never started them. PT's BP is still elevated today.   Pt states he is "about to turn 89" and does no t want to take any medications. Pt is currently only taking his 10 units of his Levemir daily, but states he wants to come off of this too. Pt states he has been taking medications his whole life and he no longer wants to. States he knows he is not going to "live forever".  Hypertension  This is a chronic problem. The current episode started more than 1 year ago. The problem has been waxing and waning since onset. The problem is uncontrolled. Associated symptoms include peripheral edema ("a little"). Pertinent negatives include no malaise/fatigue or shortness of breath. Risk factors for coronary artery disease include male gender. Past treatments include nothing. The current treatment provides no improvement. Hypertensive end-organ damage includes CAD/MI. There is no history of kidney disease, CVA or heart failure.      Review of Systems  Constitutional: Negative for malaise/fatigue.  Respiratory: Negative for shortness of breath.   Cardiovascular: Positive for leg swelling.  All other systems reviewed and are negative.      Objective:   Physical Exam  Constitutional: He is oriented to person, place, and time. He appears well-developed and well-nourished. No distress.  HENT:  Head: Normocephalic.  Right Ear: External ear normal.  Left Ear: External ear normal.  Nose: Nose normal.  Mouth/Throat: Oropharynx is clear and moist.  Eyes: Pupils are equal, round, and reactive to light. Right eye exhibits no discharge. Left eye exhibits no discharge.  Neck: Normal range of motion. Neck supple. No thyromegaly present.  Cardiovascular: Normal rate, regular rhythm, normal heart sounds and intact  distal pulses.   No murmur heard. Pulmonary/Chest: Effort normal and breath sounds normal. No respiratory distress. He has no wheezes.  Abdominal: Soft. Bowel sounds are normal. He exhibits no distension. There is no tenderness.  Musculoskeletal: Normal range of motion. He exhibits edema (2+ BLE). He exhibits no tenderness.  Neurological: He is alert and oriented to person, place, and time.  Skin: Skin is warm and dry. No rash noted. No erythema.  Psychiatric: He has a normal mood and affect. His behavior is normal. Judgment and thought content normal.  Vitals reviewed.     BP (!) 172/72   Pulse 85   Temp (!) 97 F (36.1 C) (Oral)   Ht '5\' 11"'$  (1.803 m)   Wt 197 lb (89.4 kg)   BMI 27.48 kg/m      Assessment & Plan:  1. Essential hypertension, benign - BMP8+EGFR  2. Noncompliance - BMP8+EGFR   Pt does not want to be on any HTN medication and states he will not take any Discussed importance of low salt diet -Dash diet information given -Exercise encouraged - Stress Management  -RTO in 1 month   Evelina Dun, FNP

## 2017-04-11 NOTE — Patient Instructions (Signed)

## 2017-04-12 LAB — BMP8+EGFR
BUN/Creatinine Ratio: 13 (ref 10–24)
BUN: 22 mg/dL (ref 8–27)
CALCIUM: 9.4 mg/dL (ref 8.6–10.2)
CO2: 22 mmol/L (ref 20–29)
CREATININE: 1.74 mg/dL — AB (ref 0.76–1.27)
Chloride: 105 mmol/L (ref 96–106)
GFR, EST AFRICAN AMERICAN: 39 mL/min/{1.73_m2} — AB (ref >59)
GFR, EST NON AFRICAN AMERICAN: 34 mL/min/{1.73_m2} — AB (ref >59)
Glucose: 115 mg/dL — ABNORMAL HIGH (ref 65–99)
POTASSIUM: 4.1 mmol/L (ref 3.5–5.2)
Sodium: 142 mmol/L (ref 134–144)

## 2017-05-14 ENCOUNTER — Ambulatory Visit (INDEPENDENT_AMBULATORY_CARE_PROVIDER_SITE_OTHER): Payer: Medicare Other | Admitting: Family

## 2017-05-14 ENCOUNTER — Encounter: Payer: Self-pay | Admitting: Family

## 2017-05-14 VITALS — BP 171/84 | HR 88 | Temp 97.2°F | Ht 71.0 in | Wt 197.0 lb

## 2017-05-14 DIAGNOSIS — N189 Chronic kidney disease, unspecified: Secondary | ICD-10-CM | POA: Insufficient documentation

## 2017-05-14 DIAGNOSIS — N183 Chronic kidney disease, stage 3 unspecified: Secondary | ICD-10-CM

## 2017-05-14 DIAGNOSIS — E1165 Type 2 diabetes mellitus with hyperglycemia: Secondary | ICD-10-CM | POA: Diagnosis not present

## 2017-05-14 DIAGNOSIS — E1159 Type 2 diabetes mellitus with other circulatory complications: Secondary | ICD-10-CM

## 2017-05-14 DIAGNOSIS — Z794 Long term (current) use of insulin: Secondary | ICD-10-CM | POA: Diagnosis not present

## 2017-05-14 DIAGNOSIS — E118 Type 2 diabetes mellitus with unspecified complications: Secondary | ICD-10-CM | POA: Diagnosis not present

## 2017-05-14 DIAGNOSIS — E1122 Type 2 diabetes mellitus with diabetic chronic kidney disease: Secondary | ICD-10-CM

## 2017-05-14 DIAGNOSIS — Z91199 Patient's noncompliance with other medical treatment and regimen due to unspecified reason: Secondary | ICD-10-CM

## 2017-05-14 DIAGNOSIS — Z9119 Patient's noncompliance with other medical treatment and regimen: Secondary | ICD-10-CM

## 2017-05-14 DIAGNOSIS — IMO0002 Reserved for concepts with insufficient information to code with codable children: Secondary | ICD-10-CM

## 2017-05-14 DIAGNOSIS — I10 Essential (primary) hypertension: Secondary | ICD-10-CM

## 2017-05-14 MED ORDER — INSULIN DETEMIR 100 UNIT/ML FLEXPEN: 5.0000 [IU] | PEN_INJECTOR | Freq: Every day | SUBCUTANEOUS | 3 refills | Status: DC

## 2017-05-14 NOTE — Patient Instructions (Signed)
Diabetes Mellitus and Food It is important for you to manage your blood sugar (glucose) level. Your blood glucose level can be greatly affected by what you eat. Eating healthier foods in the appropriate amounts throughout the day at about the same time each day will help you control your blood glucose level. It can also help slow or prevent worsening of your diabetes mellitus. Healthy eating may even help you improve the level of your blood pressure and reach or maintain a healthy weight. General recommendations for healthful eating and cooking habits include:  Eating meals and snacks regularly. Avoid going long periods of time without eating to lose weight.  Eating a diet that consists mainly of plant-based foods, such as fruits, vegetables, nuts, legumes, and whole grains.  Using low-heat cooking methods, such as baking, instead of high-heat cooking methods, such as deep frying.  Work with your dietitian to make sure you understand how to use the Nutrition Facts information on food labels. How can food affect me? Carbohydrates Carbohydrates affect your blood glucose level more than any other type of food. Your dietitian will help you determine how many carbohydrates to eat at each meal and teach you how to count carbohydrates. Counting carbohydrates is important to keep your blood glucose at a healthy level, especially if you are using insulin or taking certain medicines for diabetes mellitus. Alcohol Alcohol can cause sudden decreases in blood glucose (hypoglycemia), especially if you use insulin or take certain medicines for diabetes mellitus. Hypoglycemia can be a life-threatening condition. Symptoms of hypoglycemia (sleepiness, dizziness, and disorientation) are similar to symptoms of having too much alcohol. If your health care provider has given you approval to drink alcohol, do so in moderation and use the following guidelines:  Women should not have more than one drink per day, and men  should not have more than two drinks per day. One drink is equal to: ? 12 oz of beer. ? 5 oz of wine. ? 1 oz of hard liquor.  Do not drink on an empty stomach.  Keep yourself hydrated. Have water, diet soda, or unsweetened iced tea.  Regular soda, juice, and other mixers might contain a lot of carbohydrates and should be counted.  What foods are not recommended? As you make food choices, it is important to remember that all foods are not the same. Some foods have fewer nutrients per serving than other foods, even though they might have the same number of calories or carbohydrates. It is difficult to get your body what it needs when you eat foods with fewer nutrients. Examples of foods that you should avoid that are high in calories and carbohydrates but low in nutrients include:  Trans fats (most processed foods list trans fats on the Nutrition Facts label).  Regular soda.  Juice.  Candy.  Sweets, such as cake, pie, doughnuts, and cookies.  Fried foods.  What foods can I eat? Eat nutrient-rich foods, which will nourish your body and keep you healthy. The food you should eat also will depend on several factors, including:  The calories you need.  The medicines you take.  Your weight.  Your blood glucose level.  Your blood pressure level.  Your cholesterol level.  You should eat a variety of foods, including:  Protein. ? Lean cuts of meat. ? Proteins low in saturated fats, such as fish, egg whites, and beans. Avoid processed meats.  Fruits and vegetables. ? Fruits and vegetables that may help control blood glucose levels, such as apples,   mangoes, and yams.  Dairy products. ? Choose fat-free or low-fat dairy products, such as milk, yogurt, and cheese.  Grains, bread, pasta, and rice. ? Choose whole grain products, such as multigrain bread, whole oats, and brown rice. These foods may help control blood pressure.  Fats. ? Foods containing healthful fats, such as  nuts, avocado, olive oil, canola oil, and fish.  Does everyone with diabetes mellitus have the same meal plan? Because every person with diabetes mellitus is different, there is not one meal plan that works for everyone. It is very important that you meet with a dietitian who will help you create a meal plan that is just right for you. This information is not intended to replace advice given to you by your health care provider. Make sure you discuss any questions you have with your health care provider. Document Released: 03/02/2005 Document Revised: 11/11/2015 Document Reviewed: 05/02/2013 Elsevier Interactive Patient Education  2017 Elsevier Inc.  

## 2017-05-14 NOTE — Progress Notes (Signed)
Subjective:    Patient ID: Curtis Chan, male    DOB: 1925-08-05, 83 y.o.   MRN: [id number]  PT presents to the office today to recheck DM. Pt is requesting to stop all of his medications. States he is 83 years old and does not want to take any medications. We discussed this on his last visit and decreased his Levemir from 16 units to 10 units. Pt's last A1C was 6.2.  Diabetes  He presents for his follow-up diabetic visit. He has type 2 diabetes mellitus. His disease course has been stable. There are no hypoglycemic associated symptoms. Pertinent negatives for diabetes include no blurred vision, no foot paresthesias and no visual change. Symptoms are stable. Diabetic complications include heart disease. Pertinent negatives for diabetic complications include no nephropathy or peripheral neuropathy. Risk factors for coronary artery disease include dyslipidemia, diabetes mellitus, family history, male sex and sedentary lifestyle. Current diabetic treatment includes insulin injections. He is following a generally healthy diet. (Does not check BS at home )  Hypertension  This is a chronic problem. The current episode started more than 1 year ago. The problem has been waxing and waning since onset. The problem is uncontrolled. Associated symptoms include peripheral edema. Pertinent negatives include no blurred vision or shortness of breath. Risk factors for coronary artery disease include diabetes mellitus, male gender and family history. Hypertensive end-organ damage includes kidney disease and CAD/MI.      Review of Systems  Eyes: Negative for blurred vision.  Respiratory: Negative for shortness of breath.   All other systems reviewed and are negative.      Objective:   Physical Exam  Constitutional: He is oriented to person, place, and time. He appears well-developed and well-nourished. No distress.  HENT:  Head: Normocephalic.  Right Ear: External ear normal.  Left Ear: External ear  normal.  Mouth/Throat: Oropharynx is clear and moist.  Eyes: Pupils are equal, round, and reactive to light. Right eye exhibits no discharge. Left eye exhibits no discharge.  Neck: Normal range of motion. Neck supple. No thyromegaly present.  Cardiovascular: Normal rate, regular rhythm and intact distal pulses.  Murmur heard. Pulmonary/Chest: Effort normal and breath sounds normal. No respiratory distress. He has no wheezes.  Abdominal: Soft. Bowel sounds are normal. He exhibits no distension. There is no tenderness.  Musculoskeletal: Normal range of motion. He exhibits edema (2+ BLE). He exhibits no tenderness.  Neurological: He is alert and oriented to person, place, and time. He has normal reflexes. No cranial nerve deficit.  Skin: Skin is warm and dry. No rash noted. No erythema.  Psychiatric: He has a normal mood and affect. His behavior is normal. Judgment and thought content normal.  Vitals reviewed.    BP (!) 171/84   Pulse 88   Temp (!) 97.2 F (36.2 C) (Oral)   Ht '5\' 11"'  (1.803 m)   Wt 197 lb (89.4 kg)   BMI 27.48 kg/m      Assessment & Plan:  1. Type 2 diabetes mellitus with stage 3 chronic kidney disease, with long-term current use of insulin (HCC) - BMP8+EGFR - Insulin Detemir (LEVEMIR FLEXTOUCH) 100 UNIT/ML Pen; Inject 5 Units into the skin daily at 10 pm.  Dispense: 15 mL; Refill: 3  2. Stage 3 chronic kidney disease (HCC) - BMP8+EGFR  3. Diabetes mellitus type 2 with complications, uncontrolled (HCC) - BMP8+EGFR - Insulin Detemir (LEVEMIR FLEXTOUCH) 100 UNIT/ML Pen; Inject 5 Units into the skin daily at 10 pm.  Dispense: 15  mL; Refill: 3  4. Noncompliance - BMP8+EGFR  5. Hypertension associated with diabetes ([place]) - BMP8+EGFR  Will decrease Levemir to 5 units from 10 units  Strict low carb diet discussed PT does not want to take any medications for BP, he is aware of risks Low salt diet encouraged RTO in 1 month Lab pending   Evelina Dun, FNP

## 2017-05-15 LAB — BMP8+EGFR
BUN/Creatinine Ratio: 10 (ref 10–24)
BUN: 22 mg/dL (ref 8–27)
CALCIUM: 9.5 mg/dL (ref 8.6–10.2)
CO2: 21 mmol/L (ref 20–29)
CREATININE: 2.1 mg/dL — AB (ref 0.76–1.27)
Chloride: 108 mmol/L — ABNORMAL HIGH (ref 96–106)
GFR calc Af Amer: 31 mL/min/{1.73_m2} — ABNORMAL LOW (ref >59)
GFR calc non Af Amer: 27 mL/min/{1.73_m2} — ABNORMAL LOW (ref >59)
GLUCOSE: 102 mg/dL — AB (ref 65–99)
Potassium: 4.2 mmol/L (ref 3.5–5.2)
Sodium: 145 mmol/L — ABNORMAL HIGH (ref 134–144)

## 2017-06-04 ENCOUNTER — Other Ambulatory Visit: Payer: Self-pay

## 2017-06-04 ENCOUNTER — Encounter (HOSPITAL_COMMUNITY): Payer: Self-pay | Admitting: *Deleted

## 2017-06-04 ENCOUNTER — Emergency Department (HOSPITAL_COMMUNITY): Payer: Medicare Other

## 2017-06-04 ENCOUNTER — Inpatient Hospital Stay (HOSPITAL_COMMUNITY)
Admission: EM | Admit: 2017-06-04 | Discharge: 2017-06-08 | DRG: 280 | Disposition: A | Discharge status: Home Health | Payer: Medicare Other | Attending: Internal Medicine | Admitting: Internal Medicine

## 2017-06-04 ENCOUNTER — Encounter: Payer: Self-pay | Admitting: Pediatrics

## 2017-06-04 ENCOUNTER — Ambulatory Visit (INDEPENDENT_AMBULATORY_CARE_PROVIDER_SITE_OTHER): Payer: Medicare Other | Admitting: Pediatrics

## 2017-06-04 VITALS — BP 159/84 | HR 112 | Temp 96.7°F | Resp 24 | Ht 71.0 in | Wt 192.4 lb

## 2017-06-04 DIAGNOSIS — Z794 Long term (current) use of insulin: Secondary | ICD-10-CM | POA: Diagnosis not present

## 2017-06-04 DIAGNOSIS — R000 Tachycardia, unspecified: Secondary | ICD-10-CM | POA: Diagnosis not present

## 2017-06-04 DIAGNOSIS — I483 Typical atrial flutter: Secondary | ICD-10-CM | POA: Diagnosis not present

## 2017-06-04 DIAGNOSIS — Z7982 Long term (current) use of aspirin: Secondary | ICD-10-CM

## 2017-06-04 DIAGNOSIS — I130 Hypertensive heart and chronic kidney disease with heart failure and stage 1 through stage 4 chronic kidney disease, or unspecified chronic kidney disease: Secondary | ICD-10-CM | POA: Diagnosis not present

## 2017-06-04 DIAGNOSIS — I214 Non-ST elevation (NSTEMI) myocardial infarction: Secondary | ICD-10-CM | POA: Diagnosis present

## 2017-06-04 DIAGNOSIS — I5082 Biventricular heart failure: Secondary | ICD-10-CM | POA: Diagnosis not present

## 2017-06-04 DIAGNOSIS — R531 Weakness: Secondary | ICD-10-CM

## 2017-06-04 DIAGNOSIS — I481 Persistent atrial fibrillation: Secondary | ICD-10-CM | POA: Diagnosis not present

## 2017-06-04 DIAGNOSIS — E1151 Type 2 diabetes mellitus with diabetic peripheral angiopathy without gangrene: Secondary | ICD-10-CM | POA: Diagnosis not present

## 2017-06-04 DIAGNOSIS — Z833 Family history of diabetes mellitus: Secondary | ICD-10-CM | POA: Diagnosis not present

## 2017-06-04 DIAGNOSIS — I5021 Acute systolic (congestive) heart failure: Secondary | ICD-10-CM | POA: Diagnosis present

## 2017-06-04 DIAGNOSIS — I4891 Unspecified atrial fibrillation: Secondary | ICD-10-CM | POA: Diagnosis not present

## 2017-06-04 DIAGNOSIS — IMO0002 Reserved for concepts with insufficient information to code with codable children: Secondary | ICD-10-CM | POA: Diagnosis present

## 2017-06-04 DIAGNOSIS — N289 Disorder of kidney and ureter, unspecified: Secondary | ICD-10-CM | POA: Diagnosis not present

## 2017-06-04 DIAGNOSIS — I255 Ischemic cardiomyopathy: Secondary | ICD-10-CM | POA: Diagnosis not present

## 2017-06-04 DIAGNOSIS — Z66 Do not resuscitate: Secondary | ICD-10-CM | POA: Diagnosis present

## 2017-06-04 DIAGNOSIS — Z8673 Personal history of transient ischemic attack (TIA), and cerebral infarction without residual deficits: Secondary | ICD-10-CM | POA: Diagnosis not present

## 2017-06-04 DIAGNOSIS — R600 Localized edema: Secondary | ICD-10-CM | POA: Diagnosis present

## 2017-06-04 DIAGNOSIS — E1165 Type 2 diabetes mellitus with hyperglycemia: Secondary | ICD-10-CM | POA: Diagnosis present

## 2017-06-04 DIAGNOSIS — Z7189 Other specified counseling: Secondary | ICD-10-CM | POA: Diagnosis not present

## 2017-06-04 DIAGNOSIS — I2510 Atherosclerotic heart disease of native coronary artery without angina pectoris: Secondary | ICD-10-CM | POA: Diagnosis not present

## 2017-06-04 DIAGNOSIS — Z87891 Personal history of nicotine dependence: Secondary | ICD-10-CM | POA: Diagnosis not present

## 2017-06-04 DIAGNOSIS — I5041 Acute combined systolic (congestive) and diastolic (congestive) heart failure: Secondary | ICD-10-CM | POA: Diagnosis not present

## 2017-06-04 DIAGNOSIS — R0602 Shortness of breath: Secondary | ICD-10-CM | POA: Diagnosis not present

## 2017-06-04 DIAGNOSIS — R0600 Dyspnea, unspecified: Secondary | ICD-10-CM | POA: Diagnosis not present

## 2017-06-04 DIAGNOSIS — Z515 Encounter for palliative care: Secondary | ICD-10-CM

## 2017-06-04 DIAGNOSIS — E1122 Type 2 diabetes mellitus with diabetic chronic kidney disease: Secondary | ICD-10-CM | POA: Diagnosis present

## 2017-06-04 DIAGNOSIS — I4892 Unspecified atrial flutter: Secondary | ICD-10-CM | POA: Diagnosis present

## 2017-06-04 DIAGNOSIS — I340 Nonrheumatic mitral (valve) insufficiency: Secondary | ICD-10-CM | POA: Diagnosis not present

## 2017-06-04 DIAGNOSIS — I10 Essential (primary) hypertension: Secondary | ICD-10-CM

## 2017-06-04 DIAGNOSIS — I429 Cardiomyopathy, unspecified: Secondary | ICD-10-CM | POA: Diagnosis not present

## 2017-06-04 DIAGNOSIS — E1159 Type 2 diabetes mellitus with other circulatory complications: Secondary | ICD-10-CM | POA: Diagnosis present

## 2017-06-04 DIAGNOSIS — I252 Old myocardial infarction: Secondary | ICD-10-CM

## 2017-06-04 DIAGNOSIS — N189 Chronic kidney disease, unspecified: Secondary | ICD-10-CM | POA: Diagnosis present

## 2017-06-04 DIAGNOSIS — E118 Type 2 diabetes mellitus with unspecified complications: Secondary | ICD-10-CM

## 2017-06-04 DIAGNOSIS — N183 Chronic kidney disease, stage 3 (moderate): Secondary | ICD-10-CM | POA: Diagnosis present

## 2017-06-04 DIAGNOSIS — I350 Nonrheumatic aortic (valve) stenosis: Secondary | ICD-10-CM | POA: Diagnosis not present

## 2017-06-04 HISTORY — DX: Chronic kidney disease, stage 3 (moderate): N18.3

## 2017-06-04 HISTORY — DX: Type 2 diabetes mellitus without complications: E11.9

## 2017-06-04 HISTORY — DX: Essential (primary) hypertension: I10

## 2017-06-04 HISTORY — DX: Personal history of transient ischemic attack (TIA), and cerebral infarction without residual deficits: Z86.73

## 2017-06-04 HISTORY — DX: Chronic kidney disease, stage 3 unspecified: N18.30

## 2017-06-04 LAB — BASIC METABOLIC PANEL
Anion gap: 12 (ref 5–15)
BUN: 26 mg/dL — AB (ref 6–20)
CO2: 19 mmol/L — AB (ref 22–32)
Calcium: 8.9 mg/dL (ref 8.9–10.3)
Chloride: 109 mmol/L (ref 101–111)
Creatinine, Ser: 2.04 mg/dL — ABNORMAL HIGH (ref 0.61–1.24)
GFR calc Af Amer: 32 mL/min — ABNORMAL LOW (ref >60)
GFR, EST NON AFRICAN AMERICAN: 27 mL/min — AB (ref >60)
GLUCOSE: 102 mg/dL — AB (ref 65–99)
POTASSIUM: 4.5 mmol/L (ref 3.5–5.1)
Sodium: 140 mmol/L (ref 135–145)

## 2017-06-04 LAB — CBC WITH DIFFERENTIAL/PLATELET
BASOS PCT: 0 %
Basophils Absolute: 0 10*3/uL (ref 0.0–0.1)
EOS ABS: 0.4 10*3/uL (ref 0.0–0.7)
Eosinophils Relative: 5 %
HCT: 37.4 % — ABNORMAL LOW (ref 39.0–52.0)
HEMOGLOBIN: 13.3 g/dL (ref 13.0–17.0)
Lymphocytes Relative: 26 %
Lymphs Abs: 2.1 10*3/uL (ref 0.7–4.0)
MCH: 30.8 pg (ref 26.0–34.0)
MCHC: 35.6 g/dL (ref 30.0–36.0)
MCV: 86.6 fL (ref 78.0–100.0)
Monocytes Absolute: 0.6 10*3/uL (ref 0.1–1.0)
Monocytes Relative: 7 %
NEUTROS PCT: 62 %
Neutro Abs: 5 10*3/uL (ref 1.7–7.7)
PLATELETS: 138 10*3/uL — AB (ref 150–400)
RBC: 4.32 MIL/uL (ref 4.22–5.81)
RDW: 13.5 % (ref 11.5–15.5)
WBC: 8 10*3/uL (ref 4.0–10.5)

## 2017-06-04 LAB — GLUCOSE, CAPILLARY: Glucose-Capillary: 151 mg/dL — ABNORMAL HIGH (ref 65–99)

## 2017-06-04 LAB — PROTIME-INR
INR: 1.58
PROTHROMBIN TIME: 18.7 s — AB (ref 11.4–15.2)

## 2017-06-04 LAB — I-STAT TROPONIN, ED: TROPONIN I, POC: 1.83 ng/mL — AB (ref 0.00–0.08)

## 2017-06-04 LAB — TSH: TSH: 2.517 u[IU]/mL (ref 0.350–4.500)

## 2017-06-04 LAB — HEPARIN LEVEL (UNFRACTIONATED): Heparin Unfractionated: 0.37 IU/mL (ref 0.30–0.70)

## 2017-06-04 LAB — MRSA PCR SCREENING: MRSA BY PCR: NEGATIVE

## 2017-06-04 LAB — BRAIN NATRIURETIC PEPTIDE: B NATRIURETIC PEPTIDE 5: 1263 pg/mL — AB (ref 0.0–100.0)

## 2017-06-04 LAB — TROPONIN I: TROPONIN I: 5.39 ng/mL — AB (ref <0.03)

## 2017-06-04 MED ORDER — DILTIAZEM LOAD VIA INFUSION
10.0000 mg | Freq: Once | INTRAVENOUS | Status: AC
Start: 2017-06-04 — End: 2017-06-04
  Administered 2017-06-04: 10 mg via INTRAVENOUS
  Filled 2017-06-04: qty 10

## 2017-06-04 MED ORDER — FUROSEMIDE 10 MG/ML IJ SOLN
20.0000 mg | Freq: Once | INTRAMUSCULAR | Status: AC
  Administered 2017-06-04: 20 mg via INTRAVENOUS
  Filled 2017-06-04: qty 2

## 2017-06-04 MED ORDER — INSULIN ASPART 100 UNIT/ML ~~LOC~~ SOLN
0.0000 [IU] | Freq: Three times a day (TID) | SUBCUTANEOUS | Status: DC
  Administered 2017-06-05: 1 [IU] via SUBCUTANEOUS
  Administered 2017-06-06: 2 [IU] via SUBCUTANEOUS
  Administered 2017-06-07 (×2): 1 [IU] via SUBCUTANEOUS

## 2017-06-04 MED ORDER — INSULIN DETEMIR 100 UNIT/ML ~~LOC~~ SOLN
5.0000 [IU] | Freq: Every day | SUBCUTANEOUS | Status: DC
  Administered 2017-06-04: 5 [IU] via SUBCUTANEOUS
  Filled 2017-06-04 (×2): qty 0.05

## 2017-06-04 MED ORDER — ASPIRIN 81 MG PO CHEW: 81.0000 mg | CHEWABLE_TABLET | Freq: Every day | ORAL | Status: DC

## 2017-06-04 MED ORDER — INSULIN ASPART 100 UNIT/ML ~~LOC~~ SOLN: 0.0000 [IU] | Freq: Every day | SUBCUTANEOUS | Status: DC

## 2017-06-04 MED ORDER — SODIUM CHLORIDE 0.9 % IV BOLUS (SEPSIS)
500.0000 mL | Freq: Once | INTRAVENOUS | Status: AC
  Administered 2017-06-04: 500 mL via INTRAVENOUS

## 2017-06-04 MED ORDER — ONDANSETRON HCL 4 MG/2ML IJ SOLN: 4.0000 mg | Freq: Four times a day (QID) | INTRAMUSCULAR | Status: DC | PRN

## 2017-06-04 MED ORDER — DILTIAZEM HCL-DEXTROSE 100-5 MG/100ML-% IV SOLN (PREMIX)
5.0000 mg/h | INTRAVENOUS | Status: DC
  Administered 2017-06-04: 5 mg/h via INTRAVENOUS
  Administered 2017-06-05: 10 mg/h via INTRAVENOUS
  Filled 2017-06-04 (×2): qty 100

## 2017-06-04 MED ORDER — ACETAMINOPHEN 325 MG PO TABS: 650.0000 mg | ORAL_TABLET | ORAL | Status: DC | PRN

## 2017-06-04 MED ORDER — HEPARIN (PORCINE) IN NACL 100-0.45 UNIT/ML-% IJ SOLN
1300.0000 [IU]/h | INTRAMUSCULAR | Status: DC
  Administered 2017-06-04: 1200 [IU]/h via INTRAVENOUS
  Administered 2017-06-05 – 2017-06-07 (×3): 1300 [IU]/h via INTRAVENOUS
  Filled 2017-06-04 (×4): qty 250

## 2017-06-04 MED ORDER — HEPARIN BOLUS VIA INFUSION
4000.0000 [IU] | Freq: Once | INTRAVENOUS | Status: AC
  Administered 2017-06-04: 4000 [IU] via INTRAVENOUS

## 2017-06-04 MED ORDER — ENSURE ENLIVE PO LIQD
237.0000 mL | Freq: Two times a day (BID) | ORAL | Status: DC
  Administered 2017-06-05 – 2017-06-07 (×6): 237 mL via ORAL

## 2017-06-04 NOTE — Progress Notes (Signed)
Called by RN, the patient's latest troponin results. Troponin went From 1.83 to 5.39.  Patient was learlier admitted by Dr. Lorin Mercy, With complaints of weakness and shortness of breath. He was found to be in new onset atrial flutter, Started on Cardizem drip and full dose heparin per pharmacy. He was seen by cardiology, and plan is for echo in a.m. Patient is DO NOT RESUSCITATE.  Currently denies any chest pain, heart rate is controlled. Called and discussed with cardiology at [facility], discussed with Dr. Teena Dunk, recommends supportive care. With advanced age and renal insufficiency, Creatinine 2.04, patient may not be a cardiac cath candidate at this time.  Further recommendations as per rounding cardiology team in a.m.

## 2017-06-04 NOTE — Progress Notes (Signed)
CRITICAL VALUE ALERT  Critical Value:  Troponin 5.39  Date & Time Notied:  06/04/2017 2215  Provider Notified: paged admitting Dr Lorin Mercy, she told me to page mid level. Paged midlevel [place] and did not hear back so page and spoke with Dr Darrick Meigs at 2300   Orders Received/Actions taken: Dr. Darrick Meigs said he was going to speak woth the cardiologist on call to see what they recommended

## 2017-06-04 NOTE — Consult Note (Signed)
CARDIOLOGY CONSULT NOTE       Patient ID: Curtis Chan MRN: [id number] DOB/AGE: 81-Mar-1927 81 y.o.  Admit date: 06/04/2017 Referring Physician: Long  Primary Physician: Sharion Balloon, FNP Primary Cardiologist: New will follow at [facility] Reason for Consultation: Atrial fibrillation   Active Problems:   * No active hospital problems. *   HPI:  83 y.o. came to ER due to fatigue , malaise and poor appetite. Daughter brought him as wife is in [place]. Has had mild cough and more dyspnea. No edema, PND or orthopnea no chest pain. Indicates history of irregular heart beats but no DCC and never on blood thinners  No history of CAD History of right ICA stenosis 50-69% by duplex this year. DM and HTN No recent ECG one in 2014 normal sinus rhythm. He is currently comfortable with HR 115-120 and ER doctor has ordered iv heparin and cardizem. Distant history of "mini stroke" no residual deficits  CHA2DS2/VAS  =    This patients CHA2DS2-VASc Score and unadjusted Ischemic Stroke Rate (% per year) is equal to 9.7 % stroke rate/year from a score of 6  Above score calculated as 1 point each if present [CHF, HTN, DM, Vascular=MI/PAD/Aortic Plaque, Age if 65-74, or Male] Above score calculated as 2 points each if present [Age > 75, or Stroke/TIA/TE]           ROS All other systems reviewed and negative except as noted above  Past Medical History:  Diagnosis Date  . Cataract   . Diabetes mellitus without complication ([place])   . Hypertension   . Stroke [facility]) 2011   mini stroke    Family History  Problem Relation Age of Onset  . Diabetes Sister   . Diabetes Sister   . Diabetes Brother   . Diabetes Brother   . Diabetes Brother   . Diabetes Brother   . Cancer Father     Social History   Socioeconomic History  . Marital status: Married    Spouse name: Not on file  . Number of children: Not on file  . Years of education: Not on file  . Highest education level: Not on file  Social  Needs  . Financial resource strain: Not on file  . Food insecurity - worry: Not on file  . Food insecurity - inability: Not on file  . Transportation needs - medical: Not on file  . Transportation needs - non-medical: Not on file  Occupational History  . Not on file  Tobacco Use  . Smoking status: Former Smoker    Packs/day: 0.25    Years: 35.00    Pack years: 8.75    Types: Cigarettes    Last attempt to quit: 04/25/1983    Years since quitting: 34.1  . Smokeless tobacco: Never Used  Substance and Sexual Activity  . Alcohol use: No  . Drug use: No  . Sexual activity: Yes    Birth control/protection: None  Other Topics Concern  . Not on file  Social History Narrative  . Not on file    Past Surgical History:  Procedure Laterality Date  . CATARACT EXTRACTION W/PHACO Left 04/28/2013   Procedure: CATARACT EXTRACTION PHACO AND INTRAOCULAR LENS PLACEMENT LEFT EYE;  Surgeon: Tonny Branch, MD;  Location: [facility];  Service: Ophthalmology;  Laterality: Left;  CDE: 15.99.  06/25/13 caratract and lens placement in right eye  . CATARACT EXTRACTION W/PHACO Right 06/26/2013   Procedure: CATARACT EXTRACTION PHACO AND INTRAOCULAR LENS PLACEMENT (I[facility]) CDE 13.44;  Surgeon: Tonny Branch, MD;  Location: [facility];  Service: Ophthalmology;  Laterality: Right;  . SURGERY OF LIP        . diltiazem (CARDIZEM) infusion 5 mg/hr (06/04/17 1448)  . heparin 1,200 Units/hr (06/04/17 1435)    Physical Exam: Blood pressure (!) 136/94, pulse (!) 125, temperature 97.8 F (36.6 C), temperature source Oral, resp. rate 16, height 5\' 11"  (1.803 m), weight 192 lb (87.1 kg), SpO2 97 %.    Affect appropriate Elderly black male  HEENT:  Edentulous  Neck supple with no adenopathy JVP normal no bruits no thyromegaly Lungs clear with no wheezing and good diaphragmatic motion Heart:  S1/S2 no murmur, no rub, gallop or click PMI normal Abdomen: benighn, BS positve, no tenderness, no AAA no bruit.  No HSM or HJR Distal  pulses intact with no bruits No edema Neuro non-focal Skin warm and dry No muscular weakness   Labs:   Lab Results  Component Value Date   WBC 8.0 06/04/2017   HGB 13.3 06/04/2017   HCT 37.4 (L) 06/04/2017   MCV 86.6 06/04/2017   PLT 138 (L) 06/04/2017   No results for input(s): NA, K, CL, CO2, BUN, CREATININE, CALCIUM, PROT, BILITOT, ALKPHOS, ALT, AST, GLUCOSE in the last 168 hours.  Invalid input(s): LABALBU No results found for: CKTOTAL, CKMB, CKMBINDEX, TROPONINI  Lab Results  Component Value Date   CHOL 189 04/05/2017   CHOL 195 05/20/2015   Lab Results  Component Value Date   HDL 46 04/05/2017   HDL 42 05/20/2015   Lab Results  Component Value Date   LDLCALC 128 (H) 04/05/2017   LDLCALC 134 (H) 05/20/2015   Lab Results  Component Value Date   TRIG 73 04/05/2017   TRIG 97 05/20/2015   Lab Results  Component Value Date   CHOLHDL 4.1 04/05/2017   CHOLHDL 4.6 05/20/2015   No results found for: LDLDIRECT    Radiology: Dg Chest Portable 1 View  Result Date: 06/04/2017 CLINICAL DATA:  Acute shortness of breath EXAM: PORTABLE CHEST 1 VIEW COMPARISON:  None. FINDINGS: Cardiomegaly and mild pulmonary vascular congestion noted. There is no evidence of focal airspace disease, pulmonary edema, suspicious pulmonary nodule/mass, pleural effusion, or pneumothorax. No acute bony abnormalities are identified. IMPRESSION: Cardiomegaly with mild pulmonary vascular congestion. Electronically Signed   By: Margarette Canada M.D.   On: 06/04/2017 14:13    EKG: Afib non specific ST changes    ASSESSMENT AND PLAN:  Afib: likely contributing to weakness. Agree with cardizem and heparin. Echo to evaluste  EF and atrial sizes If EF normal would start with rate control and low dose NOAC such as eliquis 2.5 bid for CHA2VASC 6. IF EF low consider inpatient TEE/DCC   HTN:  Well controlled.  Continue current medications and low sodium Dash type diet.    DM:  Discussed low carb diet.   Target hemoglobin A1c is 6.5 or less.  Continue current medications.  Carotid:  Known moderate right sided stenosis f/u duplex May 2019    Signed: Jenkins Rouge 06/04/2017, 2:58 PM

## 2017-06-04 NOTE — ED Triage Notes (Signed)
Pt c/o difficulty breathing, decreased appetite, weakness x couple of days. Pt was seen by PCP and had EKG done and was told to come to ED for evaluation due to A. Fib with RVR.

## 2017-06-04 NOTE — ED Notes (Signed)
Gave patient meal tray as requested.

## 2017-06-04 NOTE — ED Notes (Signed)
Date and time results received: 06/04/17 1404 (use smartphrase ".now" to insert current time)  Test: trop Critical Value: 1.83  Name of Provider Notified: long  Orders Received? Or Actions Taken?: none at this time

## 2017-06-04 NOTE — Progress Notes (Signed)
Called to notify  Troponin at  About 5.0  Pt  On med , supportive  Care and  Echo is awaited Asymptomatic DNR  Pt Will let  AM team furtehr  eval ,  For now  Supportive med  optimsied care

## 2017-06-04 NOTE — Progress Notes (Signed)
  Subjective:   Patient ID: Curtis Chan, male    DOB: 10/17/1925, 83 y.o.   MRN: [id number] CC: Shortness of Breath and Poor Appetite  HPI: Curtis Chan is a 83 y.o. male presenting for Shortness of Breath and Poor Appetite  Ringing in his ears comes and goes has been bothering him more the last two weeks Seen for regular follow up visit 3 weeks ago, was able to drive himself to the appt at that time Here today with his daughter For the past 1-2 weeks has had new onset SOB, fatigue Can't walk more than a few feet at home without having to sit and catch his breath Not speaking in complete sentences due to SOB Appetite is down, drinking less fluids  Has had R foot sore that he is following with Curtis Chan/podiatry about Foot is not any more tender Not on antibiotics now Has b/l LE swelling that he and his daughter think is worse over the past week Having more SOB and loss of appetite  No chest pain  Has been able to stay fairly active prior to the last couple weeks, recently retired from working in a tire shop  Relevant past medical, surgical, family and social history reviewed. Allergies and medications reviewed and updated. Social History   Tobacco Use  Smoking Status Former Smoker  . Packs/day: 0.25  . Years: 35.00  . Pack years: 8.75  . Types: Cigarettes  . Last attempt to quit: 04/25/1983  . Years since quitting: 34.1  Smokeless Tobacco Never Used   ROS: Per HPI   Objective:    BP (!) 159/84   Pulse (!) 112   Temp (!) 96.7 F (35.9 C) (Oral)   Resp (!) 24   Ht 5\' 11"  (1.803 m)   Wt 192 lb 6.4 oz (87.3 kg)   SpO2 100%   BMI 26.83 kg/m   Wt Readings from Last 3 Encounters:  06/04/17 192 lb 6.4 oz (87.3 kg)  05/14/17 197 lb (89.4 kg)  04/11/17 197 lb (89.4 kg)    Gen: NAD, alert, cooperative with exam, NCAT EYES: EOMI, no conjunctival injection, or no icterus ENT:  OP without erythema LYMPH: no cervical LAD CV: tachycardic, irregular, normal S1/S2,   distal pulses 2+ b/l Resp: CTABL, no wheezes, normal WOB Abd: +BS, soft, NTND. no guarding or organomegaly Ext: 2+ pitting edema bilateral lower extremities to mid shin, right greater than left, warm Neuro: Alert and oriented Skin: 7mm shallow ulceration right bottom of foot, below fifth metatarsal Some erythema bilateral lower legs  EKG: HR 114, no pwaves before qrs  Assessment & Plan:  Curtis Chan was seen today for shortness of breath and poor appetite.  Diagnoses and all orders for this visit:  Dyspnea, unspecified type -     EKG 12-Lead  Tachycardia -     EKG 12-Lead  Atrial fibrillation, unspecified type (Curtis Chan) suspect new atrial fibrillation cause of current symptoms, with ongoing shortness of breath will refer to the emergency room for labs, echo if appropriate. With b/l LE swelling concern for CHF. No chest pain, symptoms stable, daughter going to drive him to ED.  Follow up plan: F/u 1-2 weeks, sooner if needed Curtis Found, MD [place]

## 2017-06-04 NOTE — ED Provider Notes (Signed)
Emergency Department Provider Note   I have reviewed the triage vital signs and the nursing notes.   HISTORY  Chief Complaint Abnormal ECG   HPI Curtis Chan is a 83 y.o. male with PMH of DM, HTN, and prior CVA resents to the emergency department for evaluation of difficulty breathing and generalized weakness over the past 2 days.  Patient states he has had history of irregular heartbeat in the distant past but chose not to treat at that time.  He is not on anticoagulation.  He denies any chest pain with his difficulty breathing.  No fevers, chills, or other signs of infection.  States that he was having some intermittent symptoms prior to 2 days ago but they have become more constant and severe.  He occasionally feels like he is lightheaded and might pass out.   Past Medical History:  Diagnosis Date  . Cataract   . Diabetes mellitus without complication ([place])   . Hypertension   . Stroke [facility]) 2011   mini stroke    Patient Active Problem List   Diagnosis Date Noted  . Atrial flutter with rapid ventricular response ([place]) 06/04/2017  . Type 2 diabetes mellitus with diabetic chronic kidney disease ([place]) 05/14/2017  . Chronic kidney disease (CKD) 05/14/2017  . Noncompliance 04/05/2017  . BPH (benign prostatic hyperplasia) 07/22/2014  . PVD (peripheral vascular disease) ([place]) 03/31/2014  . Diabetes mellitus type 2 with complications, uncontrolled ([place]) 05/05/2013  . Hypertension associated with diabetes ([place]) 05/05/2013  . Atherosclerosis of native arteries of the extremities with ulceration ([place]) 04/25/2013    Past Surgical History:  Procedure Laterality Date  . CATARACT EXTRACTION W/PHACO Left 04/28/2013   Procedure: CATARACT EXTRACTION PHACO AND INTRAOCULAR LENS PLACEMENT LEFT EYE;  Surgeon: Tonny Branch, MD;  Location: [facility];  Service: Ophthalmology;  Laterality: Left;  CDE: 15.99.  06/25/13 caratract and lens placement in right eye  . CATARACT EXTRACTION W/PHACO Right  06/26/2013   Procedure: CATARACT EXTRACTION PHACO AND INTRAOCULAR LENS PLACEMENT (IOC) CDE 13.44;  Surgeon: Tonny Branch, MD;  Location: [facility];  Service: Ophthalmology;  Laterality: Right;  . ORCHIECTOMY     unilateral, traumatic  . SURGERY OF LIP      Current Outpatient Rx  . Order #: [id number] Class: Historical Med  . Order #: [id number] Class: Print  . Order #: [id number] Class: No Print  . Order #: [id number] Class: Normal    Allergies Patient has no known allergies.  Family History  Problem Relation Age of Onset  . Diabetes Sister   . Diabetes Sister   . Diabetes Brother   . Diabetes Brother   . Diabetes Brother   . Diabetes Brother   . Cancer Father     Social History Social History   Tobacco Use  . Smoking status: Former Smoker    Packs/day: 0.25    Years: 35.00    Pack years: 8.75    Types: Cigarettes    Last attempt to quit: 04/25/1983    Years since quitting: 34.1  . Smokeless tobacco: Never Used  Substance Use Topics  . Alcohol use: No  . Drug use: No    Review of Systems  Constitutional: No fever/chills. Positive generalized weakness.  Eyes: No visual changes. ENT: No sore throat. Cardiovascular: Denies chest pain. Respiratory: Positive shortness of breath. Gastrointestinal: No abdominal pain.  No nausea, no vomiting.  No diarrhea.  No constipation. Genitourinary: Negative for dysuria. Musculoskeletal: Negative for back pain. Skin: Negative for rash. Neurological: Negative for headaches, focal  weakness or numbness.  10-point ROS otherwise negative.  ____________________________________________   PHYSICAL EXAM:  VITAL SIGNS: ED Triage Vitals [06/04/17 1314]  Enc Vitals Group     BP (!) 157/107     Pulse Rate (!) 131     Resp (!) 26     Temp 97.8 F (36.6 C)     Temp Source Oral     SpO2 100 %     Weight 192 lb (87.1 kg)     Height 5\' 11"  (1.803 m)    Constitutional: Alert and oriented. Well appearing and in no acute distress. Eyes:  Conjunctivae are normal. Head: Atraumatic. Nose: No congestion/rhinnorhea. Mouth/Throat: Mucous membranes are moist.  Neck: No stridor.  Cardiovascular: Normal rate, regular rhythm. Good peripheral circulation. Grossly normal heart sounds.   Respiratory: Normal respiratory effort.  No retractions. Lungs CTAB. Gastrointestinal: Soft and nontender. No distention.  Musculoskeletal: No lower extremity tenderness nor edema. No gross deformities of extremities. Neurologic:  Normal speech and language. No gross focal neurologic deficits are appreciated.  Skin:  Skin is warm, dry and intact. No rash noted.  ____________________________________________   LABS (all labs ordered are listed, but only abnormal results are displayed)  Labs Reviewed  BRAIN NATRIURETIC PEPTIDE - Abnormal; Notable for the following components:      Result Value   B Natriuretic Peptide 1,263.0 (*)    All other components within normal limits  CBC WITH DIFFERENTIAL/PLATELET - Abnormal; Notable for the following components:   HCT 37.4 (*)    Platelets 138 (*)    All other components within normal limits  PROTIME-INR - Abnormal; Notable for the following components:   Prothrombin Time 18.7 (*)    All other components within normal limits  BASIC METABOLIC PANEL - Abnormal; Notable for the following components:   CO2 19 (*)    Glucose, Bld 102 (*)    BUN 26 (*)    Creatinine, Ser 2.04 (*)    GFR calc non Af Amer 27 (*)    GFR calc Af Amer 32 (*)    All other components within normal limits  I-STAT TROPONIN, ED - Abnormal; Notable for the following components:   Troponin i, poc 1.83 (*)    All other components within normal limits  HEPARIN LEVEL (UNFRACTIONATED)  CBC  HEPARIN LEVEL (UNFRACTIONATED)   ____________________________________________  EKG   EKG Interpretation  Date/Time:  Monday June 04 2017 13:21:46 EST Ventricular Rate:  131 PR Interval:    QRS Duration: 78 QT Interval:  320 QTC  Calculation: 472 R Axis:   59 Text Interpretation:  Atrial flutter with variable A-V block Anteroseptal infarct , age undetermined T wave abnormality, consider inferior ischemia Abnormal ECG No STEMI.  Confirmed by Nanda Quinton [phone]) on 06/04/2017 1:34:34 PM       ____________________________________________  RADIOLOGY  Dg Chest Portable 1 View  Result Date: 06/04/2017 CLINICAL DATA:  Acute shortness of breath EXAM: PORTABLE CHEST 1 VIEW COMPARISON:  None. FINDINGS: Cardiomegaly and mild pulmonary vascular congestion noted. There is no evidence of focal airspace disease, pulmonary edema, suspicious pulmonary nodule/mass, pleural effusion, or pneumothorax. No acute bony abnormalities are identified. IMPRESSION: Cardiomegaly with mild pulmonary vascular congestion. Electronically Signed   By: Margarette Canada M.D.   On: 06/04/2017 14:13    ____________________________________________   PROCEDURES  Procedure(s) performed:   Procedures  CRITICAL CARE Performed by: Margette Fast Total critical care time: 45 minutes Critical care time was exclusive of separately billable procedures and treating  other patients. Critical care was necessary to treat or prevent imminent or life-threatening deterioration. Critical care was time spent personally by me on the following activities: development of treatment plan with patient and/or surrogate as well as nursing, discussions with consultants, evaluation of patient's response to treatment, examination of patient, obtaining history from patient or surrogate, ordering and performing treatments and interventions, ordering and review of laboratory studies, ordering and review of radiographic studies, pulse oximetry and re-evaluation of patient's condition.  Nanda Quinton, MD Emergency Medicine  ____________________________________________   INITIAL IMPRESSION / ASSESSMENT AND PLAN / ED COURSE  Pertinent labs & imaging results that were available during  my care of the patient were reviewed by me and considered in my medical decision making (see chart for details).  Patient presents to the emergency department for evaluation of generalized weakness and shortness of breath over the past 2 days.  Currently in a flutter with RVR.  Awake, alert, normal blood pressure.  Plan for rate control, cardiac workup, cardiology consultation, and likely admission.  He reports a history of abnormal heart rate in the past which required hospitalization but he is not anticoagulated or on rate control medication at this time.   HR decreased with Diltiazem. Reviewed Cardiology consult note. Plan for hosptialist admission.   Discussed patient's case with Hospitalist, Dr. Lorin Mercy to request admission. Patient and family (if present) updated with plan. Care transferred to Hospitalist service.  I reviewed all nursing notes, vitals, pertinent old records, EKGs, labs, imaging (as available).  ____________________________________________  FINAL CLINICAL IMPRESSION(S) / ED DIAGNOSES  Final diagnoses:  Atrial flutter with rapid ventricular response (HCC)  Generalized weakness     MEDICATIONS GIVEN DURING THIS VISIT:  Medications  diltiazem (CARDIZEM) 1 mg/mL load via infusion 10 mg (10 mg Intravenous Bolus from Bag 06/04/17 1448)    And  diltiazem (CARDIZEM) 100 mg in dextrose 5% 122mL (1 mg/mL) infusion (5 mg/hr Intravenous New Bag/Given 06/04/17 1448)  heparin bolus via infusion 4,000 Units (4,000 Units Intravenous Bolus from Bag 06/04/17 1435)    Followed by  heparin ADULT infusion 100 units/mL (25000 units/268mL sodium chloride 0.45%) (1,200 Units/hr Intravenous New Bag/Given 06/04/17 1435)  sodium chloride 0.9 % bolus 500 mL (0 mLs Intravenous Stopped 06/04/17 1628)     Note:  This document was prepared using Dragon voice recognition software and may include unintentional dictation errors.  Nanda Quinton, MD Emergency Medicine    Allsion Nogales, Wonda Old[facility],  MD 06/04/17 1728

## 2017-06-04 NOTE — Progress Notes (Signed)
ANTICOAGULATION CONSULT NOTE - Initial Consult  Pharmacy Consult for heparin Indication: atrial fibrillation  No Known Allergies  Patient Measurements: Height: 5\' 11"  (180.3 cm) Weight: 192 lb (87.1 kg) IBW/kg (Calculated) : 75.3 HEPARIN DW (KG): 87.1  Vital Signs: Temp: 97.8 F (36.6 C) (12/17 1314) Temp Source: Oral (12/17 1314) BP: 157/107 (12/17 1314) Pulse Rate: 131 (12/17 1314)  Labs: No results for input(s): HGB, HCT, PLT, APTT, LABPROT, INR, HEPARINUNFRC, HEPRLOWMOCWT, CREATININE, CKTOTAL, CKMB, TROPONINI in the last 72 hours.  CrCl cannot be calculated (Patient's most recent lab result is older than the maximum 21 days allowed.).   Medical History: Past Medical History:  Diagnosis Date  . Cataract   . Diabetes mellitus without complication ([place])   . Hypertension   . Stroke [facility]) 2011   mini stroke    Medications:  See med rec  Assessment: 83 y.o. male presenting for Shortness of Breath and Poor Appetite. Pt sent to ED by PCP and for evaluation due to Atrial fib. Pharmacy asked to start heparin.  Goal of Therapy:  Heparin level 0.3-0.7 units/ml Monitor platelets by anticoagulation protocol: Yes   Plan:  Give 4000 units bolus x 1 Start heparin infusion at 1200 units/hr Check anti-Xa level in 6-8 hours and daily while on heparin Continue to monitor H&H and platelets  Isac Sarna, BS Vena Austria, BCPS Clinical Pharmacist Pager [phone] 06/04/2017,2:04 PM

## 2017-06-04 NOTE — H&P (Signed)
History and Physical    Curtis Chan PXT:[id number] DOB: 1926/01/14 DOA: 06/04/2017  PCP: Sharion Balloon, FNP Consultants:  Irving Shows - podiatry Patient coming from:  Home - lives with wife; NOK:  Daughter, [phone]   Chief Complaint: weakness, SOB  HPI: Curtis Chan is a 83 y.o. male with medical history significant of TIA; HTN; and DM presenting with weakness and SOB.  Hias daughter reports that he has been in bad shape since Saturday AM - SOB, no appetite, not drinking much, very weak.  It did not improve but he wanted to see his PCP and so they went today and were sent to the ER.  No CP, no palpitations.  SOB was with any exertion.  He continues to be SOB intermittently just with attempt at conversation.  Some intermittent SOB even with resting.  Unable to lie flat, had to sleep sitting up.  Years ago he was admitted at [facility] for an issue with his heart but his daughter is not certain what the problem was since it was so long ago.  He reports having had a heart attack about 15 years ago with catheterization, no intervention.  Denies h/o taking anticoagulation.    He took himself off medication for his BP about 2-3 months ago.     ED Course: Afib, Dilt infusion, on Heparin.  Elevated troponin.  Cardiology has seen, Dr. Johnsie Cancel thinks it is probably rate-related demand ischemia.  Suggest Echo.    Review of Systems: As per HPI; otherwise review of systems reviewed and negative.    Past Medical History:  Diagnosis Date  . Cataract   . Diabetes mellitus without complication ([place])   . Hypertension   . Stroke [facility]) 2011   mini stroke    Past Surgical History:  Procedure Laterality Date  . CATARACT EXTRACTION W/PHACO Left 04/28/2013   Procedure: CATARACT EXTRACTION PHACO AND INTRAOCULAR LENS PLACEMENT LEFT EYE;  Surgeon: Tonny Branch, MD;  Location: [facility];  Service: Ophthalmology;  Laterality: Left;  CDE: 15.99.  06/25/13 caratract and lens placement in right eye  . CATARACT  EXTRACTION W/PHACO Right 06/26/2013   Procedure: CATARACT EXTRACTION PHACO AND INTRAOCULAR LENS PLACEMENT (IOC) CDE 13.44;  Surgeon: Tonny Branch, MD;  Location: [facility];  Service: Ophthalmology;  Laterality: Right;  . ORCHIECTOMY     unilateral, traumatic  . SURGERY OF LIP      Social History   Socioeconomic History  . Marital status: Married    Spouse name: Not on file  . Number of children: Not on file  . Years of education: Not on file  . Highest education level: Not on file  Social Needs  . Financial resource strain: Not on file  . Food insecurity - worry: Not on file  . Food insecurity - inability: Not on file  . Transportation needs - medical: Not on file  . Transportation needs - non-medical: Not on file  Occupational History  . Not on file  Tobacco Use  . Smoking status: Former Smoker    Packs/day: 0.25    Years: 35.00    Pack years: 8.75    Types: Cigarettes    Last attempt to quit: 04/25/1983    Years since quitting: 34.1  . Smokeless tobacco: Never Used  Substance and Sexual Activity  . Alcohol use: No  . Drug use: No  . Sexual activity: Yes    Birth control/protection: None  Other Topics Concern  . Not on file  Social History Narrative  .  Not on file    No Known Allergies  Family History  Problem Relation Age of Onset  . Diabetes Sister   . Diabetes Sister   . Diabetes Brother   . Diabetes Brother   . Diabetes Brother   . Diabetes Brother   . Cancer Father     Prior to Admission medications   Medication Sig Start Date End Date Taking? Authorizing Provider  aspirin 81 MG tablet Take 81 mg by mouth daily.   Yes [provider]  glucose blood (FREESTYLE LITE) test strip Use to check blood glucose twice a day.  Dx:  250.03 05/05/13  Yes Cherre Robins, PharmD  Insulin Detemir (LEVEMIR FLEXTOUCH) 100 UNIT/ML Pen Inject 5 Units into the skin daily at 10 pm. 05/14/17  Yes Hawks, Theador Hawthorne, FNP  ULTICARE MINI PEN NEEDLES 31G X 6 MM MISC USE WITH  INSULIN PEN ONCE A DAY. 01/17/17  Yes Sharion Balloon, FNP    Physical Exam: Vitals:   06/04/17 1730 06/04/17 1800 06/04/17 1808 06/04/17 1830  BP: (!) 159/80 129/86 129/86 (!) 148/105  Pulse: 62 (!) 45 70 (!) 45  Resp: 13 19 20  (!) 36  Temp:      TempSrc:      SpO2: 100% 98% 99% 99%  Weight:      Height:         General:  Appears calm and comfortable and is NAD; intermittently very dyspneic with conversation but generally able to hold long conversations without dyspnea Eyes:  PERRL, EOMI, normal lids, iris ENT:  grossly normal hearing, lips & tongue, mmm; edentulous Neck:  no LAD, masses or thyromegaly Cardiovascular:  Irregularly irregular, no m/r/g.  Respiratory:   CTA bilaterally with no wheezes/rales/rhonchi.  Normal respiratory effort. Abdomen:  soft, NT, ND, NABS Skin:  no rash or induration seen on limited exam.  Mild ecchymosis from skin picking on the left temple. Musculoskeletal: RLE is significantly larger than the left (possible acute on chronic based on patient/daughter input), 2-3+ RLE edema vs. 1-2+ on L Lower extremity:   Limited foot exam with no ulcerations, healing wound to R plantar surface on the lateral side.  2+ distal pulses. Psychiatric:  grossly normal mood and affect, speech fluent and appropriate, AOx3 Neurologic:  CN 2-12 grossly intact, moves all extremities in coordinated fashion, sensation intact    Radiological Exams on Admission: Dg Chest Portable 1 View  Result Date: 06/04/2017 CLINICAL DATA:  Acute shortness of breath EXAM: PORTABLE CHEST 1 VIEW COMPARISON:  None. FINDINGS: Cardiomegaly and mild pulmonary vascular congestion noted. There is no evidence of focal airspace disease, pulmonary edema, suspicious pulmonary nodule/mass, pleural effusion, or pneumothorax. No acute bony abnormalities are identified. IMPRESSION: Cardiomegaly with mild pulmonary vascular congestion. Electronically Signed   By: Margarette Canada M.D.   On: 06/04/2017 14:13     EKG: Independently reviewed.  Afib/flutter with rate 131; nonspecific ST changes, likely rate-related  Labs on Admission: I have personally reviewed the available labs and imaging studies at the time of the admission.  Pertinent labs:   BUN 26/Creatinine 2.04/GFR 32 - stable INR 1.58 BNP 1263 Troponin 1.83   Assessment/Plan Principal Problem:   Atrial flutter with rapid ventricular response (HCC) Active Problems:   Diabetes mellitus type 2 with complications, uncontrolled (HCC)   Hypertension associated with diabetes (HCC)   Chronic kidney disease (CKD)   Acute CHF (congestive heart failure) (HCC)   Edema of right lower extremity   A fib/flutter with RVR -Patient presenting  with what appears to be new-onset afib.  -Etiology may be related to HTN, ischemic heart disease, CHF (vs. CHF resulting from afib), DVT/PE (see below) -Since the afib onset is unknown, will focus on rate control and searching for the underlying cause at this time. -Will admit to SDU for Diltiazem drip as per protocol with plan to transition to PO Diltiazem once heart rate is controlled.   -Troponin q6h x 3 -Will continue ASA 81 mg PO daily.   -Treat chest pain with NTG prn.   -Will request Echocardiogram for further evaluation  -Will risk stratify with FLP and HgbA1c; will also order TSH.  -Repeat EKG in AM and plan to continue cardiology consultation in AM (they already saw in the ER today) -Heart rate is currently well controlled unless the patient talks too much or gets too excited. -CHA2DS2-VASc Score is >2 and so patient will need oral anticoagulation.  -He is currently on Heparin, will continue. -Patient is not on any anti-coagulatants at home. Patient has high risk for fall.  His HAS-BLED score is 3 (3.74% bleeding risk/year) to 4 (8.7% bleeding risk/year). -Will need to carefully consider risk/benefit and then decide whether ongoing oral anticoagulation is his best option and which agent is  preferred.   RLE edema -While he has a h/o R>L LE edema, it appears to be more marked than usual. -He does not have any erythema of the RLE and has a negative Homan's and no cords. -Will order RLE DVT US for tomorrow  -If DVT US is positive, he will need CTA to evaluate for PE - as it is possible that DVT led to PE led to weakness and SOB and then to afib rather than afib leading to the other symptoms  CHF -May be true CHF or may be resulting from new onset afib -CXR consistent with pulmonary edema -Elevated BNP -Awaiting Echo tomorrow -Will give Lasix 20 mg IV x 1 -Continue [place] O2 for now -Stable kidney function at this time, will follow -Repeat EKG in AM -Will r/o with serial troponins although doubt ACS based on symptoms; more likely, elevated troponin is due to demand ischemia in this very elderly patient with at least several days of symptomatic afib  DM -Will check A1c -Continue Levemir -Cover with sensitive-scale SSI  HTN -Not on medications -He stopped taking Bystolic and amlodipine on his own several months ago  -While he does not have known prior h/o afib, it is possible that the Bystolic was keeping him rate controlled and that stopping this medication contributed to his becoming symptomatic now  CKD -Appears to be stable, stage 3 CKD -Will follow with BMP   DVT prophylaxis: Heparin drip Code Status:  DNR - confirmed with patient/family Family Communication: Daughter present throughout evaluation  Disposition Plan:  Home once clinically improved Consults called: Cardiology  Admission status: Admit - It is my clinical opinion that admission to INPATIENT is reasonable and necessary because this patient will require at least 2 midnights in the hospital to treat this condition based on the medical complexity of the problems presented.  Given the aforementioned information, the predictability of an adverse outcome is felt to be significant.    Karmen Bongo MD [facility]  If note is complete, please contact covering daytime or nighttime physician. [email or web address] Password [facility]  06/04/2017, 7:56 PM

## 2017-06-05 ENCOUNTER — Encounter (HOSPITAL_COMMUNITY): Payer: Self-pay | Admitting: Cardiology

## 2017-06-05 ENCOUNTER — Inpatient Hospital Stay (HOSPITAL_COMMUNITY): Payer: Medicare Other

## 2017-06-05 DIAGNOSIS — I4892 Unspecified atrial flutter: Secondary | ICD-10-CM

## 2017-06-05 DIAGNOSIS — N183 Chronic kidney disease, stage 3 (moderate): Secondary | ICD-10-CM

## 2017-06-05 DIAGNOSIS — I214 Non-ST elevation (NSTEMI) myocardial infarction: Secondary | ICD-10-CM

## 2017-06-05 DIAGNOSIS — I340 Nonrheumatic mitral (valve) insufficiency: Secondary | ICD-10-CM

## 2017-06-05 DIAGNOSIS — I10 Essential (primary) hypertension: Secondary | ICD-10-CM

## 2017-06-05 LAB — CBC
HEMATOCRIT: 38.1 % — AB (ref 39.0–52.0)
HEMOGLOBIN: 13.3 g/dL (ref 13.0–17.0)
MCH: 30.3 pg (ref 26.0–34.0)
MCHC: 34.9 g/dL (ref 30.0–36.0)
MCV: 86.8 fL (ref 78.0–100.0)
Platelets: 145 10*3/uL — ABNORMAL LOW (ref 150–400)
RBC: 4.39 MIL/uL (ref 4.22–5.81)
RDW: 13.5 % (ref 11.5–15.5)
WBC: 9.2 10*3/uL (ref 4.0–10.5)

## 2017-06-05 LAB — ECHOCARDIOGRAM COMPLETE
AO mean calculated velocity dopler: 132 cm/s
AV Mean grad: 8 mmHg
AV Peak grad: 13 mmHg
AV VEL mean LVOT/AV: 0.51
AV pk vel: 181 cm/s
Ao pk vel: 0.56 m/s
E decel time: 169 msec
E/e' ratio: 16.81
FS: 10 % — AB (ref 28–44)
Height: 69 in
IVS/LV PW RATIO, ED: 0.98
LA ID, A-P, ES: 18 mm
LA diam end sys: 18 mm
LA diam index: 0.88 cm/m2
LA vol A4C: 75.5 ml
LV E/e' medial: 16.81
LV E/e'average: 16.81
LV PW d: 12.3 mm — AB (ref 0.6–1.1)
LV dias vol index: 70 mL/m2
LV dias vol: 144 mL (ref 62–150)
LV e' LATERAL: 5.11 cm/s
LV sys vol index: 49 mL/m2
LV sys vol: 101 mL — AB (ref 21–61)
LVOT VTI: 19 cm
LVOT peak VTI: 0.51 cm
LVOT peak grad rest: 4 mmHg
LVOT peak vel: 101 cm/s
Lateral S' vel: 7.83 cm/s
MV Dec: 169
MV Peak grad: 3 mmHg
MV pk A vel: 76.7 m/s
MV pk E vel: 85.9 m/s
P 1/2 time: 376 ms
RV sys press: 41 mmHg
Reg peak vel: 254 cm/s
Simpson's disk: 30
Stroke v: 43 ml
TAPSE: 14.2 mm
TDI e' lateral: 5.11
TDI e' medial: 6.31
TR max vel: 254 cm/s
VTI: 37.4 cm
Weight: 3005.31 oz

## 2017-06-05 LAB — LIPID PANEL
CHOL/HDL RATIO: 3.9 ratio
Cholesterol: 154 mg/dL (ref 0–200)
HDL: 40 mg/dL — AB (ref >40)
LDL CALC: 107 mg/dL — AB (ref 0–99)
TRIGLYCERIDES: 33 mg/dL (ref <150)
VLDL: 7 mg/dL (ref 0–40)

## 2017-06-05 LAB — HEPARIN LEVEL (UNFRACTIONATED)
Heparin Unfractionated: 0.28 IU/mL — ABNORMAL LOW (ref 0.30–0.70)
Heparin Unfractionated: 0.37 IU/mL (ref 0.30–0.70)

## 2017-06-05 LAB — GLUCOSE, CAPILLARY
GLUCOSE-CAPILLARY: 81 mg/dL (ref 65–99)
Glucose-Capillary: 48 mg/dL — ABNORMAL LOW (ref 65–99)
Glucose-Capillary: 93 mg/dL (ref 65–99)
Glucose-Capillary: 147 mg/dL — ABNORMAL HIGH (ref 65–99)
Glucose-Capillary: 71 mg/dL (ref 65–99)

## 2017-06-05 LAB — TROPONIN I
TROPONIN I: 8.69 ng/mL — AB (ref <0.03)
Troponin I: 7.64 ng/mL (ref <0.03)

## 2017-06-05 MED ORDER — ASPIRIN EC 81 MG PO TBEC
81.0000 mg | DELAYED_RELEASE_TABLET | Freq: Every day | ORAL | Status: DC
  Administered 2017-06-05 – 2017-06-07 (×3): 81 mg via ORAL
  Filled 2017-06-05 (×3): qty 1

## 2017-06-05 MED ORDER — METOPROLOL TARTRATE 25 MG PO TABS
25.0000 mg | ORAL_TABLET | Freq: Two times a day (BID) | ORAL | Status: DC
  Administered 2017-06-05 – 2017-06-06 (×3): 25 mg via ORAL
  Filled 2017-06-05 (×3): qty 1

## 2017-06-05 MED ORDER — ATORVASTATIN CALCIUM 40 MG PO TABS
40.0000 mg | ORAL_TABLET | Freq: Every day | ORAL | Status: DC
  Administered 2017-06-05 – 2017-06-07 (×3): 40 mg via ORAL
  Filled 2017-06-05 (×3): qty 1

## 2017-06-05 NOTE — Progress Notes (Addendum)
Inpatient Diabetes Program Recommendations  AACE/ADA: New Consensus Statement on Inpatient Glycemic Control (2015)  Target Ranges:  Prepandial:   less than 140 mg/dL      Peak postprandial:   less than 180 mg/dL (1-2 hours)      Critically ill patients:  140 - 180 mg/dL   Results for BILLY, TURVEY (MRN [id number]) as of 06/05/2017 11:16  Ref. Range 06/04/2017 20:49 06/05/2017 07:18 06/05/2017 07:43  Glucose-Capillary Latest Ref Range: 65 - 99 mg/dL 151 (H)  Levemir 5 units @21 :14 48 (L) 81   Review of Glycemic Control  Diabetes history: DM2 Outpatient Diabetes medications: Levemir 5 units QHS Current orders for Inpatient glycemic control: Levemir 5 units QHS, Novolog 0-9 units TID with meals, Novolog 0-5 units QHS  Inpatient Diabetes Program Recommendations: Insulin - Basal: Fasting glucose 48 mg/dl this morning. Please consider discontinuing Levemir at this time.  Thanks, Barnie Alderman, RN, MSN, CDE Diabetes Coordinator Inpatient Diabetes Program [phone] (Team Pager from 8am to 5pm)

## 2017-06-05 NOTE — Progress Notes (Signed)
PROGRESS NOTE    Curtis Chan  KGY:[id number] DOB: [birth year] DOA: 06/04/2017 PCP: Sharion Balloon, FNP   Brief Narrative:  Curtis Chan is a 83 y.o. male with medical history significant of TIA; HTN; and DM presenting with weakness and SOB.  Hias daughter reports that he has been in bad shape since Saturday AM - SOB, no appetite, not drinking much, very weak.  It did not improve but he wanted to see his PCP and so they went today and were sent to the ER.  No CP, no palpitations.  SOB was with any exertion.  He continues to be SOB intermittently just with attempt at conversation.  Some intermittent SOB even with resting.  Unable to lie flat, had to sleep sitting up.  Years ago he was admitted at [facility] for an issue with his heart but his daughter is not certain what the problem was since it was so long ago.  He reports having had a heart attack about 15 years ago with catheterization, no intervention.  Denies h/o taking anticoagulation.    He took himself off medication for his BP about 2-3 months ago.    Overnight from 06/04/2017 to 06/05/2017 troponin increased dramatically up to 8.69.  Cardiology fellow was phoned in patient as he is a DNR and has renal insufficiency was kept at [facility] for further evaluation by cardiology in a.m. Medical management of NSTEMI.   Assessment & Plan:   Principal Problem:   Atrial flutter with rapid ventricular response (HCC) Active Problems:   Diabetes mellitus type 2 with complications, uncontrolled ([place])   Hypertension associated with diabetes ([place])   Chronic kidney disease (CKD)   Acute CHF (congestive heart failure) (HCC)   Edema of right lower extremity  NSTEMI - medical management given patients CKD and patient preference to hold off on invasive testing at this time - heparin drip - cardiology following - begin beta blocker - start statin  A fib/flutter with RVR -Patient presenting with what appears to be new-onset afib.    -Etiology may be related to HTN, ischemic heart disease, CHF (vs. CHF resulting from afib), DVT/PE (see below) - diltiazem discontinued   -Will continue ASA 81 mg PO daily.   -Treat chest pain with NTG prn.   -f/u echocardiogram results - hold on oral anticoagulation at this time  RLE edema -While he has a h/o R>L LE edema, it appears to be more marked than usual. -He does not have any erythema of the RLE and has a negative Homan's and no cords. - f/u LE ultrasound  CHF -May be true CHF or may be resulting from new onset afib -CXR consistent with pulmonary edema -Elevated BNP - f/u echo results   DM -A1c pending -Continue Levemir -Cover with sensitive-scale SSI  HTN - blood pressure controlled at this time  CKD -Appears to be stable, stage 3 CKD -Will follow with serial BMP     DVT prophylaxis: Heparin drip Code Status: DNR Family Communication: no family is bedside this am at time of exam Disposition Plan: Pending evaluation by cardiology   Consultants:   Cardiology  Procedures:   None  Antimicrobials:   None   Subjective: Patient reports that he feels slightly less tired and short of breath this am.  Seen after cardiology evaluation.  Patient reiterates that he will not get cath but would opt for medical management of NSTEMI.  No complaints of chest pain, chest pressure, shortness of breath, increased work of  breathing, abdominal pain.    Objective: Vitals:   06/05/17 0415 06/05/17 0430 06/05/17 0500 06/05/17 0719  BP: 119/69 130/69    Pulse: 79 72  83  Resp: 13 (!) 9  (!) 23  Temp:    (!) 97.5 F (36.4 C)  TempSrc:    Oral  SpO2: 97% 98%  98%  Weight:   85.2 kg (187 lb 13.3 oz)   Height:        Intake/Output Summary (Last 24 hours) at 06/05/2017 0808 Last data filed at 06/05/2017 0422 Gross per 24 hour  Intake -  Output 1025 ml  Net -1025 ml   Filed Weights   06/04/17 1314 06/04/17 2030 06/05/17 0500  Weight: 87.1 kg (192 lb)  86.4 kg (190 lb 7.6 oz) 85.2 kg (187 lb 13.3 oz)    Examination:  General exam: Appears calm and comfortable, elderly male Respiratory system: Clear to auscultation. Respiratory effort normal. Cardiovascular system: S1 & S2 heard, RRR. II/VI systolic murmur; No JVD, rubs, gallops or clicks. Minimal ankle edema bilaterally. Gastrointestinal system: Abdomen is nondistended, soft and nontender. No organomegaly or masses felt. Normal bowel sounds heard. Central nervous system: Alert and oriented. No focal neurological deficits. Extremities: Symmetric 5 x 5 power. Skin: No rashes, lesions or ulcers Psychiatry: Judgement and insight appear normal. Mood & affect appropriate.     Data Reviewed: I have personally reviewed following labs and imaging studies  CBC: Recent Labs  Lab 06/04/17 1351 06/05/17 0307  WBC 8.0 9.2  NEUTROABS 5.0  --   HGB 13.3 13.3  HCT 37.4* 38.1*  MCV 86.6 86.8  PLT 138* 532*   Basic Metabolic Panel: Recent Labs  Lab 06/04/17 1518  NA 140  K 4.5  CL 109  CO2 19*  GLUCOSE 102*  BUN 26*  CREATININE 2.04*  CALCIUM 8.9   GFR: Estimated Creatinine Clearance: 26.6 mL/min (A) (by C-G formula based on SCr of 2.04 mg/dL (H)). Liver Function Tests: No results for input(s): AST, ALT, ALKPHOS, BILITOT, PROT, ALBUMIN in the last 168 hours. No results for input(s): LIPASE, AMYLASE in the last 168 hours. No results for input(s): AMMONIA in the last 168 hours. Coagulation Profile: Recent Labs  Lab 06/04/17 1430  INR 1.58   Cardiac Enzymes: Recent Labs  Lab 06/04/17 2123 06/05/17 0307  TROPONINI 5.39* 8.69*   BNP (last 3 results) No results for input(s): PROBNP in the last 8760 hours. HbA1C: No results for input(s): HGBA1C in the last 72 hours. CBG: Recent Labs  Lab 06/04/17 2049 06/05/17 0718 06/05/17 0743  GLUCAP 151* 48* 81   Lipid Profile: Recent Labs    06/05/17 0307  CHOL 154  HDL 40*  LDLCALC 107*  TRIG 33  CHOLHDL 3.9   Thyroid  Function Tests: Recent Labs    06/04/17 1351  TSH 2.517   Anemia Panel: No results for input(s): VITAMINB12, FOLATE, FERRITIN, TIBC, IRON, RETICCTPCT in the last 72 hours. Sepsis Labs: No results for input(s): PROCALCITON, LATICACIDVEN in the last 168 hours.  Recent Results (from the past 240 hour(s))  MRSA PCR Screening     Status: None   Collection Time: 06/04/17  8:22 PM  Result Value Ref Range Status   MRSA by PCR NEGATIVE NEGATIVE Final    Comment:        The GeneXpert MRSA Assay (FDA approved for NASAL specimens only), is one component of a comprehensive MRSA colonization surveillance program. It is not intended to diagnose MRSA infection nor to guide  or monitor treatment for MRSA infections.          Radiology Studies: Dg Chest Portable 1 View  Result Date: 06/04/2017 CLINICAL DATA:  Acute shortness of breath EXAM: PORTABLE CHEST 1 VIEW COMPARISON:  None. FINDINGS: Cardiomegaly and mild pulmonary vascular congestion noted. There is no evidence of focal airspace disease, pulmonary edema, suspicious pulmonary nodule/mass, pleural effusion, or pneumothorax. No acute bony abnormalities are identified. IMPRESSION: Cardiomegaly with mild pulmonary vascular congestion. Electronically Signed   By: Margarette Canada M.D.   On: 06/04/2017 14:13        Scheduled Meds: . aspirin  81 mg Oral Daily  . feeding supplement (ENSURE ENLIVE)  237 mL Oral BID BM  . insulin aspart  0-5 Units Subcutaneous QHS  . insulin aspart  0-9 Units Subcutaneous TID WC  . insulin detemir  5 Units Subcutaneous Q2200   Continuous Infusions: . diltiazem (CARDIZEM) infusion Stopped (06/05/17 0418)  . heparin 1,200 Units/hr (06/04/17 1435)     LOS: 1 day    Time spent: 35 minutes    Loretha Stapler, MD [facility] Pager [phone]  If 7PM-7AM, please contact night-coverage [email or web address] Password [email or web address] 06/05/2017, 8:08 AM

## 2017-06-05 NOTE — Progress Notes (Signed)
Initial Nutrition Assessment  DOCUMENTATION CODES:  Not applicable  INTERVENTION:  Continue Ensure Enlive po BID, each supplement provides 350 kcal and 20 grams of protein  Gave diet education on maintaining weight at home  Took food preferences to Promote PO intake  NUTRITION DIAGNOSIS:  Inadequate oral intake related to poor appetite, early satiety as evidenced by per patient/family report.  GOAL:  Patient will meet greater than or equal to 90% of their needs  MONITOR:  PO intake, Supplement acceptance, Labs, Weight trends  REASON FOR ASSESSMENT:  Malnutrition Screening Tool    ASSESSMENT:  83 y/o male PMHx TIA, HTN, CKD3, DM presenting with weakness and SOB since Saturday morning. Has had no appetite, poor food/fluid intake and extreme weakness w/ severe SOB. Work up revealed [employer]. Shortly after admission, Troponin rose dramatically, consistent w/ enzymatic evidence of NSTEMI. Being treated w/ medical therapy. Does not desire invasive measures.  Spoke with patient. Daughter was at bedside and provided assistance with history recall.   Patient states that, at baseline, he eats 2x a day. He readily admits to not following any therapeutic diet; "I follow the "see-food" diet". He does not take any vitamins, minerals or nutritional supplements. He does not take laxatives or softeners habitually. Interestingly, he manages his bowel by self administering an enema every morning; he has done this for decades.   Since Saturday, he does endorse a loss of appetite, but it sounds to be more of a mild loss (eating 2 meals instead of 3). He has not had any n/v/d. He has had constipation which again he manages with enemas.   He says he weighed 220 lbs 1 year ago. Per chart, he does appear to have weighed close to 220 this past January which is a loss of 14% bw x11 months. More recently, he was 203 lbs in July. This reflects a loss of ~15 lbs x5 months. Neither measurement is clinically  significant.   RD stated that his weight loss appears to be more chronic. RD asked if he had noticed any changes in his appetite long term. He says "I just eat until im full". RD explained that part of the aging process is a diminished appetite so though he is full, he still might not be eating as much as he needs. Stated that Because his appetite is likely decreasing, he needs to prioritize calorie/protein dense foods. RD reccomended the "Big Three" of cheese, Peanut butter and whole milk which are some of the most common foods that are high in both kcals and protein. Alternatively, he could consume nutritional supplements ie ensure/boost.   At this time, he says his appetite is improved, but not back to baseline. He was agreeable to supplements. RD also took food preferences to assist with PO intake.   Physical Exam: Mild muscle wasting of temporalis, deltoids, gastrocnemius, clavicular musculature. Mild fat wasting of orbitals. No edema.   Given only one area of perceived fat wasting, did not dx w/ malnutrition  Labs Bg: had hypoglycemic event of 48, now 147, BUN/Creat: 26/2.04, A1C pending Meds: Ensure Enlive BID, IV cardizem, insulin  Recent Labs  Lab 06/04/17 1518  NA 140  K 4.5  CL 109  CO2 19*  BUN 26*  CREATININE 2.04*  CALCIUM 8.9  GLUCOSE 102*   NUTRITION - FOCUSED PHYSICAL EXAM:   Most Recent Value  Orbital Region  Mild depletion  Upper Arm Region  No depletion  Thoracic and Lumbar Region  No depletion  Buccal Region  No depletion  Temple Region  Mild depletion  Clavicle Bone Region  Mild depletion  Clavicle and Acromion Bone Region  Mild depletion  Scapular Bone Region  No depletion  Dorsal Hand  No depletion  Patellar Region  No depletion  Anterior Thigh Region  No depletion  Posterior Calf Region  Mild depletion  Edema (RD Assessment)  None      Diet Order:  Diet heart healthy/carb modified Room service appropriate? Yes; Fluid consistency: Thin  EDUCATION  NEEDS:  Education needs have been addressed  Skin: Abrasion to Face/leg. Diabetic Ulcer w/ "puncture type wound" to R foot  Last BM:  12/18- per pt  Height:  Ht Readings from Last 1 Encounters:  06/04/17 5\' 9"  (1.753 m)   Weight:  Wt Readings from Last 1 Encounters:  06/05/17 187 lb 13.3 oz (85.2 kg)   Wt Readings from Last 10 Encounters:  06/05/17 187 lb 13.3 oz (85.2 kg)  06/04/17 192 lb 6.4 oz (87.3 kg)  05/14/17 197 lb (89.4 kg)  04/11/17 197 lb (89.4 kg)  04/05/17 195 lb (88.5 kg)  01/11/17 203 lb (92.1 kg)  07/17/16 216 lb 3.2 oz (98.1 kg)  06/15/16 218 lb (98.9 kg)  02/14/16 204 lb (92.5 kg)  01/31/16 205 lb 6.4 oz (93.2 kg)   Ideal Body Weight:  72.73 kg  BMI:  Body mass index is 27.74 kg/m.  Estimated Nutritional Needs:  Kcal:  1700-1950 (20-23 kcal/kg bw) Protein:  80-95g Pro (1.1-1.3 g/kg ibw) Fluid:  1.7-2 Liters fluid  Burtis Junes RD, LDN, CNSC Clinical Nutrition Pager: [phone] 06/05/2017 5:20 PM

## 2017-06-05 NOTE — Plan of Care (Signed)
Patient understands reason he is here and has been oriented to unit.

## 2017-06-05 NOTE — Progress Notes (Signed)
*  PRELIMINARY RESULTS* Echocardiogram 2D Echocardiogram has been performed.  Curtis Chan 06/05/2017, 11:24 AM

## 2017-06-05 NOTE — Progress Notes (Signed)
CRITICAL VALUE ALERT  Critical Value:  Troponin 8.69  Date & Time Notied:  06/05/2017 0355  Provider Notified: Dr. Georgiann Mohs  Orders Received/Actions taken: none. Awaiting cardiology to see patient in the morning

## 2017-06-05 NOTE — Progress Notes (Signed)
Progress Note  Patient Name: Curtis Chan Date of Encounter: 06/05/2017  Consulting Cardiologist: Dr. Jenkins Rouge  Subjective   No chest pain. Sitting in bed side chair. Still short of breath with activity. No palpitations.  Inpatient Medications    Scheduled Meds: . aspirin  81 mg Oral Daily  . feeding supplement (ENSURE ENLIVE)  237 mL Oral BID BM  . insulin aspart  0-5 Units Subcutaneous QHS  . insulin aspart  0-9 Units Subcutaneous TID WC  . insulin detemir  5 Units Subcutaneous Q2200   Continuous Infusions: . diltiazem (CARDIZEM) infusion Stopped (06/05/17 0418)  . heparin 1,300 Units/hr (06/05/17 0819)   PRN Meds: acetaminophen, ondansetron (ZOFRAN) IV   Vital Signs    Vitals:   06/05/17 0415 06/05/17 0430 06/05/17 0500 06/05/17 0719  BP: 119/69 130/69    Pulse: 79 72  83  Resp: 13 (!) 9  (!) 23  Temp:    (!) 97.5 F (36.4 C)  TempSrc:    Oral  SpO2: 97% 98%  98%  Weight:   187 lb 13.3 oz (85.2 kg)   Height:        Intake/Output Summary (Last 24 hours) at 06/05/2017 0852 Last data filed at 06/05/2017 0846 Gross per 24 hour  Intake 320 ml  Output 1025 ml  Net -705 ml   Filed Weights   06/04/17 1314 06/04/17 2030 06/05/17 0500  Weight: 192 lb (87.1 kg) 190 lb 7.6 oz (86.4 kg) 187 lb 13.3 oz (85.2 kg)    Telemetry    Looks to be sinus rhythm with PACs this morning. Personally reviewed.  ECG    Tracing from 06/04/2017 showed atrial flutter with variable conduction, poor R-wave progression anteriorly, rule out old anterior infarct pattern. Personally reviewed.  Physical Exam   GEN:  Elderly male. No acute distress.   Neck: No JVD. Cardiac: RRR with ectopy, 2/6 systolic murmur, no gallop.  Respiratory: Nonlabored. Rhonchi, no wheezing.Marland Kitchen GI: Soft, nontender, bowel sounds present. MS:  Mild ankle edema; No deformity. Neuro:  Nonfocal. Psych: Alert and oriented x 3. Normal affect.  Labs    Chemistry Recent Labs  Lab 06/04/17 1518  NA  140  K 4.5  CL 109  CO2 19*  GLUCOSE 102*  BUN 26*  CREATININE 2.04*  CALCIUM 8.9  GFRNONAA 27*  GFRAA 32*  ANIONGAP 12     Hematology Recent Labs  Lab 06/04/17 1351 06/05/17 0307  WBC 8.0 9.2  RBC 4.32 4.39  HGB 13.3 13.3  HCT 37.4* 38.1*  MCV 86.6 86.8  MCH 30.8 30.3  MCHC 35.6 34.9  RDW 13.5 13.5  PLT 138* 145*    Cardiac Enzymes Recent Labs  Lab 06/04/17 2123 06/05/17 0307  TROPONINI 5.39* 8.69*    Recent Labs  Lab 06/04/17 1356  TROPIPOC 1.83*     BNP Recent Labs  Lab 06/04/17 1353  BNP 1,263.0*     Radiology    Dg Chest Portable 1 View  Result Date: 06/04/2017 CLINICAL DATA:  Acute shortness of breath EXAM: PORTABLE CHEST 1 VIEW COMPARISON:  None. FINDINGS: Cardiomegaly and mild pulmonary vascular congestion noted. There is no evidence of focal airspace disease, pulmonary edema, suspicious pulmonary nodule/mass, pleural effusion, or pneumothorax. No acute bony abnormalities are identified. IMPRESSION: Cardiomegaly with mild pulmonary vascular congestion. Electronically Signed   By: Margarette Canada M.D.   On: 06/04/2017 14:13    Cardiac Studies   Carotid Dopplers 11/07/2016: IMPRESSION: 1. Moderate amount of right-sided atherosclerotic plaque  results in elevated peak systolic velocities with the right internal carotid artery compatible with the 50-69% luminal narrowing range. 2. Minimal to moderate amount of left-sided atherosclerotic plaque, not resulting in a hemodynamically significant stenosis.  Patient Profile     83 y.o. male with a history of type 2 diabetes mellitus, CAD stage III, hypertension, previous stroke, and carotid artery disease. He presents with worsening shortness of breath since Saturday, found to be in atrial flutter with variable conduction, and subsequently with enzymatic evidence of NSTEMI.  Assessment & Plan    1. NSTEMI, troponin I has trended up to 8.69. Type I event certainly possible, but still may be type II in  the setting of rapid atrial flutter. He has no known history of obstructive CAD at baseline. No recent chest pain but shortness of breath since Saturday reported.  2. Typical atrial flutter with variable conduction, has had spontaneous conversion to sinus rhythm recently, follow-up ECG pending. CHADSVASC score is 7. Currently on IV heparin. Intravenous diltiazem was discontinued.  3. Essential hypertension, systolic blood pressure ranging from 110-150.  4. CKD stage III, creatinine 2.0 at this time.  5. Carotid artery disease, moderate RICA stenosis as of May 2018.   6. Type 2 diabetes mellitus, hemoglobin A1c 6.2 in October.  7. LDL 107 and HDL 40.  8. DNR status.  I reviewed the chart as well as consultation note from Dr. Johnsie Cancel. Discussed situation with patient and daughter present. Plan at this time is medical therapy rather than pursuing cardiac catheterization. He has increased risk of contrast nephropathy and also voices preference to hold off on invasive testing at this point. Echocardiogram will be obtained to assess LVEF. Would continue heparin and not pursue oral anticoagulation as yet until his clinical course is better defined. Start low-dose aspirin, start statin therapy. Will also begin beta blocker. No ACE inhibitor or ARB with CKD.  Signed, Rozann Lesches, MD  06/05/2017, 8:52 AM

## 2017-06-05 NOTE — Progress Notes (Addendum)
ANTICOAGULATION CONSULT NOTE   Pharmacy Consult for heparin Indication: atrial fibrillation  No Known Allergies  Patient Measurements: Height: 5\' 9"  (175.3 cm) Weight: 187 lb 13.3 oz (85.2 kg) IBW/kg (Calculated) : 70.7 HEPARIN DW (KG): 86.4  Vital Signs: Temp: 97.5 F (36.4 C) (12/18 0719) Temp Source: Oral (12/18 0719) BP: 130/69 (12/18 0430) Pulse Rate: 83 (12/18 0719)  Labs: Recent Labs    06/04/17 1351 06/04/17 1430 06/04/17 1518 06/04/17 2123 06/05/17 0307  HGB 13.3  --   --   --  13.3  HCT 37.4*  --   --   --  38.1*  PLT 138*  --   --   --  145*  LABPROT  --  18.7*  --   --   --   INR  --  1.58  --   --   --   HEPARINUNFRC  --   --   --  0.37 0.28*  CREATININE  --   --  2.04*  --   --   TROPONINI  --   --   --  5.39* 8.69*    Estimated Creatinine Clearance: 26.6 mL/min (A) (by C-G formula based on SCr of 2.04 mg/dL (H)).   Medical History: Past Medical History:  Diagnosis Date  . Cataract   . Diabetes mellitus without complication ([place])   . Hypertension   . Stroke [facility]) 2011   mini stroke    Medications:  See med rec  Assessment: 83 y.o. male presenting for Shortness of Breath and Poor Appetite. Pt sent to ED by PCP and for evaluation due to Atrial fib. Pharmacy asked to start heparin. Troponin elevated.  Plan supportive care.  Heparin level slightly less than goal this am.  Goal of Therapy:  Heparin level 0.3-0.7 units/ml Monitor platelets by anticoagulation protocol: Yes   Plan:  Increase heparin drip to 1300 units/hr Check heparin level later today  Excell Seltzer, Pharm D Clinical Pharmacist 06/05/2017,7:35 AM   Heparin level therapeutic.  Cont drip at 1300 units/hr and f/u am labs

## 2017-06-06 DIAGNOSIS — I350 Nonrheumatic aortic (valve) stenosis: Secondary | ICD-10-CM

## 2017-06-06 DIAGNOSIS — I5082 Biventricular heart failure: Secondary | ICD-10-CM

## 2017-06-06 DIAGNOSIS — N289 Disorder of kidney and ureter, unspecified: Secondary | ICD-10-CM

## 2017-06-06 LAB — CBC
HCT: 35.6 % — ABNORMAL LOW (ref 39.0–52.0)
Hemoglobin: 12.6 g/dL — ABNORMAL LOW (ref 13.0–17.0)
MCH: 30.7 pg (ref 26.0–34.0)
MCHC: 35.4 g/dL (ref 30.0–36.0)
MCV: 86.8 fL (ref 78.0–100.0)
PLATELETS: 125 10*3/uL — AB (ref 150–400)
RBC: 4.1 MIL/uL — ABNORMAL LOW (ref 4.22–5.81)
RDW: 13.5 % (ref 11.5–15.5)
WBC: 7.7 10*3/uL (ref 4.0–10.5)

## 2017-06-06 LAB — GLUCOSE, CAPILLARY
GLUCOSE-CAPILLARY: 156 mg/dL — AB (ref 65–99)
GLUCOSE-CAPILLARY: 113 mg/dL — AB (ref 65–99)
Glucose-Capillary: 78 mg/dL (ref 65–99)
Glucose-Capillary: 172 mg/dL — ABNORMAL HIGH (ref 65–99)

## 2017-06-06 LAB — HEMOGLOBIN A1C
Hgb A1c MFr Bld: 5.7 % — ABNORMAL HIGH (ref 4.8–5.6)
MEAN PLASMA GLUCOSE: 117 mg/dL

## 2017-06-06 LAB — HEPARIN LEVEL (UNFRACTIONATED): Heparin Unfractionated: 0.62 IU/mL (ref 0.30–0.70)

## 2017-06-06 MED ORDER — CARVEDILOL 3.125 MG PO TABS
6.2500 mg | ORAL_TABLET | Freq: Two times a day (BID) | ORAL | Status: DC
  Administered 2017-06-06 – 2017-06-08 (×4): 6.25 mg via ORAL
  Filled 2017-06-06 (×4): qty 2

## 2017-06-06 NOTE — Evaluation (Signed)
Physical Therapy Evaluation Patient Details Name: Curtis Chan MRN: [id number] DOB: 18-Dec-1925 Today's Date: 06/06/2017   History of Present Illness  Curtis Chan is a 83 y.o. male with medical history significant of TIA; HTN; and DM presenting with weakness and SOB.  Hias daughter reports that he has been in bad shape since Saturday AM - SOB, no appetite, not drinking much, very weak.  It did not improve but he wanted to see his PCP and so they went today and were sent to the ER.  No CP, no palpitations.  SOB was with any exertion.  He continues to be SOB intermittently just with attempt at conversation.  Some intermittent SOB even with resting.  Unable to lie flat, had to sleep sitting up.  Years ago he was admitted at [facility] for an issue with his heart but his daughter is not certain what the problem was since it was so long ago.  He reports having had a heart attack about 15 years ago with catheterization, no intervention.  Denies h/o taking anticoagulation.     Clinical Impression  Patient functioning near baseline for functional mobility/gait, slightly limited for gait due to unsteadiness on feet when walking without an assistive device, improved balance using RW and patient advised to use his RW when he goes home.  Patient will benefit from continued physical therapy in hospital and recommended venue below to increase strength, balance, endurance for safe ADLs and gait.    Follow Up Recommendations Home health PT;Supervision - Intermittent    Equipment Recommendations  None recommended by PT    Recommendations for Other Services       Precautions / Restrictions Precautions Precautions: Fall Restrictions Weight Bearing Restrictions: No      Mobility  Bed Mobility Overal bed mobility: Independent                Transfers Overall transfer level: Modified independent Equipment used: None                Ambulation/Gait Ambulation/Gait assistance: Min  guard Ambulation Distance (Feet): 60 Feet Assistive device: Rolling walker (2 wheeled);None Gait Pattern/deviations: Decreased step length - right;Decreased step length - left;Decreased stride length   Gait velocity interpretation: Below normal speed for age/gender General Gait Details: Patient demonstrates unsteady slightly labored cadence with short step/stride length without using an assistive device (AD), balance improved with use of RW demonstrating slow slightly labored cadence without loss of balance   Stairs            Wheelchair Mobility    Modified Rankin (Stroke Patients Only)       Balance Overall balance assessment: Needs assistance Sitting-balance support: No upper extremity supported;Feet supported Sitting balance-Leahy Scale: Good     Standing balance support: No upper extremity supported;During functional activity Standing balance-Leahy Scale: Fair Standing balance comment: fair/good using RW                             Pertinent Vitals/Pain Pain Assessment: No/denies pain    Home Living Family/patient expects to be discharged to:: Private residence Living Arrangements: Spouse/significant other Available Help at Discharge: Family Type of Home: House Home Access: Level entry     Home Layout: One level Home Equipment: Environmental consultant - 2 wheels;Cane - single point      Prior Function Level of Independence: Independent               Hand Dominance  Extremity/Trunk Assessment   Upper Extremity Assessment Upper Extremity Assessment: Generalized weakness    Lower Extremity Assessment Lower Extremity Assessment: Generalized weakness    Cervical / Trunk Assessment Cervical / Trunk Assessment: Kyphotic  Communication   Communication: No difficulties  Cognition Arousal/Alertness: Awake/alert Behavior During Therapy: WFL for tasks assessed/performed Overall Cognitive Status: Within Functional Limits for tasks assessed                                         General Comments      Exercises     Assessment/Plan    PT Assessment Patient needs continued PT services  PT Problem List Decreased strength;Decreased activity tolerance;Decreased balance;Decreased mobility       PT Treatment Interventions Gait training;Functional mobility training;Therapeutic activities;Therapeutic exercise;Patient/family education    PT Goals (Current goals can be found in the Care Plan section)  Acute Rehab PT Goals Patient Stated Goal: return home PT Goal Formulation: With patient Time For Goal Achievement: 06/08/17 Potential to Achieve Goals: Good    Frequency Min 3X/week   Barriers to discharge        Co-evaluation               AM-PAC PT "6 Clicks" Daily Activity  Outcome Measure Difficulty turning over in bed (including adjusting bedclothes, sheets and blankets)?: None Difficulty moving from lying on back to sitting on the side of the bed? : None Difficulty sitting down on and standing up from a chair with arms (e.g., wheelchair, bedside commode, etc,.)?: None Help needed moving to and from a bed to chair (including a wheelchair)?: None Help needed walking in hospital room?: A Little Help needed climbing 3-5 steps with a railing? : A Little 6 Click Score: 22    End of Session   Activity Tolerance: Patient tolerated treatment well;Patient limited by fatigue Patient left: in chair;with call bell/phone within reach Nurse Communication: Mobility status PT Visit Diagnosis: Unsteadiness on feet (R26.81);Other abnormalities of gait and mobility (R26.89);Muscle weakness (generalized) (M62.81)    Time: 0240-9735 PT Time Calculation (min) (ACUTE ONLY): 27 min   Charges:   PT Evaluation $PT Eval Low Complexity: 1 Low PT Treatments $Therapeutic Activity: 23-37 mins   PT G Codes:   PT G-Codes **NOT FOR INPATIENT CLASS** Functional Assessment Tool Used: AM-PAC 6 Clicks Basic  Mobility Functional Limitation: Mobility: Walking and moving around Mobility: Walking and Moving Around Current Status (H2992): At least 20 percent but less than 40 percent impaired, limited or restricted Mobility: Walking and Moving Around Goal Status [phone]): At least 20 percent but less than 40 percent impaired, limited or restricted Mobility: Walking and Moving Around Discharge Status [phone]): At least 20 percent but less than 40 percent impaired, limited or restricted    3:36 PM, 06/06/17 Lonell Grandchild, MPT Physical Therapist with [facility] [phone] office [phone] mobile phone

## 2017-06-06 NOTE — Plan of Care (Signed)
  Acute Rehab PT Goals(only PT should resolve) Patient Will Transfer Sit To/From Stand 06/06/2017 1538 - Progressing by Lonell Grandchild, PT Flowsheets Taken 06/06/2017 1538  Patient will transfer sit to/from stand Independently Pt Will Transfer Bed To Chair/Chair To Bed 06/06/2017 1538 - Progressing by Lonell Grandchild, PT Flowsheets Taken 06/06/2017 1538  Pt will Transfer Bed to Chair/Chair to Bed Independently Pt Will Ambulate 06/06/2017 1538 - Progressing by Lonell Grandchild, PT Flowsheets Taken 06/06/2017 1538  Pt will Ambulate 100 feet;with modified independence;with rolling walker;with cane   3:39 PM, 06/06/17 Lonell Grandchild, MPT Physical Therapist with [facility] [phone] office [phone] mobile phone

## 2017-06-06 NOTE — Progress Notes (Signed)
SATURATION QUALIFICATIONS: (This note is used to comply with regulatory documentation for home oxygen)  Patient Saturations on Room Air at Rest = 98%  Patient Saturations on Room Air while Ambulating = 91%   

## 2017-06-06 NOTE — Progress Notes (Signed)
Progress Note  Patient Name: Curtis Chan Date of Encounter: 06/06/2017  Consulting Cardiologist: Dr. Jenkins Rouge  Subjective   Patient sitting in bed, denies chest pain or shortness of breath at rest.  Inpatient Medications    Scheduled Meds: . aspirin EC  81 mg Oral Daily  . atorvastatin  40 mg Oral q1800  . feeding supplement (ENSURE ENLIVE)  237 mL Oral BID BM  . insulin aspart  0-5 Units Subcutaneous QHS  . insulin aspart  0-9 Units Subcutaneous TID WC  . metoprolol tartrate  25 mg Oral BID   Continuous Infusions: . heparin 1,300 Units/hr (06/06/17 0644)   PRN Meds: acetaminophen, ondansetron (ZOFRAN) IV   Vital Signs    Vitals:   06/05/17 1845 06/05/17 1955 06/05/17 2059 06/06/17 0600  BP: 122/82  125/68 (!) 115/56  Pulse: 82  81 68  Resp: 20  (!) 21 20  Temp: 98.3 F (36.8 C)  98.2 F (36.8 C) 98.1 F (36.7 C)  TempSrc: Oral  Oral Oral  SpO2: 98% 97% 97% 99%  Weight:    189 lb 14.4 oz (86.1 kg)  Height:        Intake/Output Summary (Last 24 hours) at 06/06/2017 1010 Last data filed at 06/06/2017 0900 Gross per 24 hour  Intake 1272.43 ml  Output 625 ml  Net 647.43 ml   Filed Weights   06/04/17 2030 06/05/17 0500 06/06/17 0600  Weight: 190 lb 7.6 oz (86.4 kg) 187 lb 13.3 oz (85.2 kg) 189 lb 14.4 oz (86.1 kg)    Telemetry    Sinus rhythm with PACs and PVCs. Personally reviewed.  ECG    Tracing from 06/05/2017 showed sinus rhythm with anteroseptal infarct pattern and unspecific T-wave abnormalities. Personally reviewed.  Physical Exam   GEN:  Elderly male. No acute distress.   Neck: No JVD. Cardiac: RRR, 2/6 systolic murmur, no gallop.  Respiratory:  No wheezing. Nonlabored at rest. GI: Soft, nontender, bowel sounds present. MS: No edema; No deformity. Neuro:  Nonfocal. Psych: Alert and oriented x 3. Normal affect.  Labs    Chemistry Recent Labs  Lab 06/04/17 1518  NA 140  K 4.5  CL 109  CO2 19*  GLUCOSE 102*  BUN 26*    CREATININE 2.04*  CALCIUM 8.9  GFRNONAA 27*  GFRAA 32*  ANIONGAP 12     Hematology Recent Labs  Lab 06/04/17 1351 06/05/17 0307 06/06/17 0435  WBC 8.0 9.2 7.7  RBC 4.32 4.39 4.10*  HGB 13.3 13.3 12.6*  HCT 37.4* 38.1* 35.6*  MCV 86.6 86.8 86.8  MCH 30.8 30.3 30.7  MCHC 35.6 34.9 35.4  RDW 13.5 13.5 13.5  PLT 138* 145* 125*    Cardiac Enzymes Recent Labs  Lab 06/04/17 2123 06/05/17 0307 06/05/17 0902  TROPONINI 5.39* 8.69* 7.64*    Recent Labs  Lab 06/04/17 1356  TROPIPOC 1.83*     BNP Recent Labs  Lab 06/04/17 1353  BNP 1,263.0*     Radiology    US Venous Img Lower Unilateral Right  Result Date: 06/05/2017 CLINICAL DATA:  Right lower extremity edema. EXAM: RIGHT LOWER EXTREMITY VENOUS DOPPLER ULTRASOUND TECHNIQUE: Gray-scale sonography with graded compression, as well as color Doppler and duplex ultrasound were performed to evaluate the lower extremity deep venous systems from the level of the common femoral vein and including the common femoral, femoral, profunda femoral, popliteal and calf veins including the posterior tibial, peroneal and gastrocnemius veins when visible. The superficial great saphenous vein was  also interrogated. Spectral Doppler was utilized to evaluate flow at rest and with distal augmentation maneuvers in the common femoral, femoral and popliteal veins. COMPARISON:  None. FINDINGS: Contralateral Common Femoral Vein: Respiratory phasicity is normal and symmetric with the symptomatic side. No evidence of thrombus. Normal compressibility. Common Femoral Vein: No evidence of thrombus. Normal compressibility, respiratory phasicity and response to augmentation. Saphenofemoral Junction: No evidence of thrombus. Normal compressibility and flow on color Doppler imaging. Profunda Femoral Vein: No evidence of thrombus. Normal compressibility and flow on color Doppler imaging. Femoral Vein: No evidence of thrombus. Normal compressibility, respiratory  phasicity and response to augmentation. Popliteal Vein: No evidence of thrombus. Normal compressibility, respiratory phasicity and response to augmentation. Calf Veins: No evidence of thrombus. Normal compressibility and flow on color Doppler imaging. Superficial Great Saphenous Vein: No evidence of thrombus. Normal compressibility. Venous Reflux:  None. Other Findings: No evidence of superficial thrombophlebitis or abnormal fluid collection. IMPRESSION: No evidence of right lower extremity deep venous thrombosis. Electronically Signed   By: Aletta Edouard M.D.   On: 06/05/2017 10:39   Dg Chest Portable 1 View  Result Date: 06/04/2017 CLINICAL DATA:  Acute shortness of breath EXAM: PORTABLE CHEST 1 VIEW COMPARISON:  None. FINDINGS: Cardiomegaly and mild pulmonary vascular congestion noted. There is no evidence of focal airspace disease, pulmonary edema, suspicious pulmonary nodule/mass, pleural effusion, or pneumothorax. No acute bony abnormalities are identified. IMPRESSION: Cardiomegaly with mild pulmonary vascular congestion. Electronically Signed   By: Margarette Canada M.D.   On: 06/04/2017 14:13    Cardiac Studies   Echocardiogram 06/05/2017: Study Conclusions  - Left ventricle: The cavity size was normal. Wall thickness was   increased in a pattern of mild LVH. The estimated ejection   fraction was 20%. Diffuse hypokinesis. There is akinesis of the   mid-apicalanteroseptal, inferior, and apical myocardium. Features   are consistent with a pseudonormal left ventricular filling   pattern, with concomitant abnormal relaxation and increased   filling pressure (grade 2 diastolic dysfunction). - Aortic valve: Moderately calcified annulus. Trileaflet; severely   calcified leaflets. There was moderate regurgitation. Mean   gradient (S): 8 mm Hg. Peak gradient (S): 13 mm Hg. Aortic   leaflet excursion is severely reduced. This could reflect severe   aortic stenosis with low gradients in the setting  of low LVEF, or   pseudo-stenosis. VTI ratio of LVOT to aortic valve: 0.51. - Aortic root: The aortic root was mildly dilated. - Mitral valve: Moderately thickened, moderately calcified leaflets   . There was mild regurgitation. - Left atrium: The atrium was mildly dilated. - Right ventricle: Systolic function was mildly reduced. - Right atrium: Central venous pressure (est): 15 mm Hg. - Tricuspid valve: There was mild regurgitation. - Pulmonary arteries: PA peak pressure: 41 mm Hg (S). - Pericardium, extracardiac: There was no pericardial effusion.  Impressions:  - Mild LVH with LVEF approximately 20%. There is diffuse   hypokinesis with akinesis of the mid to apical anteroseptal,   apical inferior, and apical myocardium. Grade 2 diastolic   dysfunction. Mild left atrial enlargement.Moderately thickened   and calcified mitral valve with mild mitral regurgitation.   Severely calcified aortic valve with moderate aortic   regurgitation. Aortic leaflet excursion is severely reduced. This   could reflect severe aortic stenosis with low gradients in the   setting of low LVEF, or pseudo-stenosis. Mild aortic root   dilatation. Mildly reduced right ventricular contraction. Mild   tricuspid regurgitation with PASP estimated 41 mmHg.  Patient Profile     83 y.o. male with a history of type 2 diabetes mellitus, CAD stage III, hypertension, previous stroke, and carotid artery disease. He presents with worsening shortness of breath since Saturday, found to be in atrial flutter with variable conduction, and subsequently with enzymatic evidence of NSTEMI.  Assessment & Plan    1. NSTEMI. No active chest pain.. Peak troponin I 8.69. Follow-up ECG reviewed and consistent with anteroseptal infarct pattern. He continues on IV heparin. Cardiac catheterization is not planned at this time.  2. Cardiomyopathy with severely reduced LVEF at 20% and also right ventricular dysfunction. There is diffuse  hypokinesis with mid to apical anteroseptal/inferior akinesis. Could be combined cardiomyopathy and at least to some degree pre-existing rather than primarily ischemic.  3. Valvular heart disease, possibility of severe aortic stenosis with low gradients in the setting of low LVEF versus pseudo-stenosis.  4. Typical atrial flutter with spontaneous conversion to sinus rhythm. CHADSVASC score is 7. Have not transitioned to [place] pending stabilization of other issues.  5. Essential hypertension, blood pressure stable at present.  6. Renal insufficiency on baseline CKD stage III.  7. DNR status.  Discussed situation and test results with patient and daughter. For now plan is to continue aspirin, Lipitor, change from Lopressor to Coreg, and continue IV heparin until tomorrow. Follow-up CBC and BMET. Ultimately will decide about DOAC. Increase activity tomorrow, reassess oxygen saturation on room air at that time. Although I did not specifically bring this up with the patient, it may be worth considering palliative care consultation given overall poor prognosis and his advanced age.  Signed, Rozann Lesches, MD  06/06/2017, 10:10 AM

## 2017-06-06 NOTE — Progress Notes (Signed)
ANTICOAGULATION CONSULT NOTE   Pharmacy Consult for heparin Indication: atrial fibrillation  No Known Allergies  Patient Measurements: Height: 5\' 9"  (175.3 cm) Weight: 189 lb 14.4 oz (86.1 kg) IBW/kg (Calculated) : 70.7 HEPARIN DW (KG): 86.4  Vital Signs: Temp: 98.1 F (36.7 C) (12/19 0600) Temp Source: Oral (12/19 0600) BP: 115/56 (12/19 0600) Pulse Rate: 68 (12/19 0600)  Labs: Recent Labs    06/04/17 1351 06/04/17 1430 06/04/17 1518  06/04/17 2123 06/05/17 0307 06/05/17 0902 06/05/17 1515 06/06/17 0435  HGB 13.3  --   --   --   --  13.3  --   --  12.6*  HCT 37.4*  --   --   --   --  38.1*  --   --  35.6*  PLT 138*  --   --   --   --  145*  --   --  125*  LABPROT  --  18.7*  --   --   --   --   --   --   --   INR  --  1.58  --   --   --   --   --   --   --   HEPARINUNFRC  --   --   --    < > 0.37 0.28*  --  0.37 0.62  CREATININE  --   --  2.04*  --   --   --   --   --   --   TROPONINI  --   --   --   --  5.39* 8.69* 7.64*  --   --    < > = values in this interval not displayed.    Estimated Creatinine Clearance: 26.7 mL/min (A) (by C-G formula based on SCr of 2.04 mg/dL (H)).   Medical History: Past Medical History:  Diagnosis Date  . Cataract   . CKD (chronic kidney disease) stage 3, GFR 30-59 ml/min (HCC)   . Essential hypertension   . History of stroke 2011  . Type 2 diabetes mellitus (HCC)     Medications:  See med rec  Assessment: 83 y.o. male presenting for Shortness of Breath and Poor Appetite. Pt sent to ED by PCP and for evaluation due to Atrial fib. Pharmacy asked to start heparin. Troponin elevated.  Plan supportive care.  Heparin level remains therapeutic today with noted trend up.  Goal of Therapy:  Heparin level 0.3-0.7 units/ml Monitor platelets by anticoagulation protocol: Yes   Plan:  Continue heparin drip at 1300 units/hr Monitor CBC, daily heparin level.  Excell Seltzer, Pharm D Clinical Pharmacist 06/06/2017,8:33 AM

## 2017-06-06 NOTE — Progress Notes (Signed)
PROGRESS NOTE    Curtis Chan  WER:[id number] DOB: Nov 26, 1925 DOA: 06/04/2017 PCP: Sharion Balloon, FNP   Brief Narrative:  Curtis Chan is a 83 y.o. male with medical history significant of TIA; HTN; and DM presenting with weakness and SOB.  Hias daughter reports that he has been in bad shape since Saturday AM - SOB, no appetite, not drinking much, very weak.  It did not improve but he wanted to see his PCP and so they went today and were sent to the ER.  No CP, no palpitations.  SOB was with any exertion.  He continues to be SOB intermittently just with attempt at conversation.  Some intermittent SOB even with resting.  Unable to lie flat, had to sleep sitting up.  Years ago he was admitted at [facility] for an issue with his heart but his daughter is not certain what the problem was since it was so long ago.  He reports having had a heart attack about 15 years ago with catheterization, no intervention.  Denies h/o taking anticoagulation.    He took himself off medication for his BP about 2-3 months ago.    Overnight from 06/04/2017 to 06/05/2017 troponin increased dramatically up to 8.69.  Cardiology fellow was phoned in patient as he is a DNR and has renal insufficiency was kept at [facility] for further evaluation by cardiology in a.m. Medical management of NSTEMI.   Assessment & Plan:   Principal Problem:   Atrial flutter with rapid ventricular response (HCC) Active Problems:   Diabetes mellitus type 2 with complications, uncontrolled (HCC)   Hypertension associated with diabetes (HCC)   Chronic kidney disease (CKD)   Acute CHF (congestive heart failure) (HCC)   Edema of right lower extremity   NSTEMI (non-ST elevated myocardial infarction) ([place])  NSTEMI - medical management given patients CKD and patient preference to hold off on invasive testing at this time -Per cardiology note will continue aspirin, Lipitor, IV heparin and then transition from Lopressor to [place] -  Possibly consider Center with cardiology that palliative care could offer patient and his family some insight into his cardiomyopathy  A fib/flutter with RVR -Patient presenting with what appears to be new-onset afib.  -Etiology may be related to HTN, ischemic heart disease, CHF (vs. CHF resulting from afib), DVT/PE (see below) - diltiazem discontinued   -Will continue ASA 81 mg PO daily.   -Treat chest pain with NTG prn.   -Echocardiogram showing ejection fraction of 20%  RLE edema -While he has a h/o R>L LE edema, it appears to be more marked than usual. -Lower extremity ultrasound negative for DVT  CHF -Ejection fraction of 20% -We will consult palliative care   DM -A1c 5.7 -Continue Levemir -Cover with sensitive-scale SSI  HTN - blood pressure controlled at this time  CKD -Appears to be stable, stage 3 CKD -BMP in a.m.     DVT prophylaxis: Heparin drip Code Status: DNR Family Communication: Daughter and grandson bedside Disposition Plan: Pending   Consultants:   Cardiology  Procedures:   None  Antimicrobials:   None   Subjective: Patient seen this morning.  He seems somewhat confused as to whether or not he had a heart attack recently or in the past.  Daughter is bedside and restates to patient that he had a heart attack recently.  Patient denies any new symptoms of chest pain, shortness of breath, weakness.  He is asking to be able to get up to use  the bathroom and having the bed alarm turned off.  Objective: Vitals:   06/05/17 1955 06/05/17 2059 06/06/17 0600 06/06/17 1358  BP:  125/68 (!) 115/56 (!) 158/84  Pulse:  81 68 78  Resp:  (!) 21 20 18   Temp:  98.2 F (36.8 C) 98.1 F (36.7 C) 98.4 F (36.9 C)  TempSrc:  Oral Oral Oral  SpO2: 97% 97% 99% 98%  Weight:   86.1 kg (189 lb 14.4 oz)   Height:        Intake/Output Summary (Last 24 hours) at 06/06/2017 1433 Last data filed at 06/06/2017 1400 Gross per 24 hour  Intake  1272.43 ml  Output 925 ml  Net 347.43 ml   Filed Weights   06/04/17 2030 06/05/17 0500 06/06/17 0600  Weight: 86.4 kg (190 lb 7.6 oz) 85.2 kg (187 lb 13.3 oz) 86.1 kg (189 lb 14.4 oz)    Examination:  General exam: Appears calm and comfortable, elderly male Respiratory system: Clear to auscultation. Respiratory effort normal. Cardiovascular system: S1 & S2 heard, RRR. II/VI systolic murmur; No JVD, rubs, gallops or clicks. Minimal ankle edema bilaterally. Gastrointestinal system: Abdomen is nondistended, soft and nontender. No organomegaly or masses felt. Normal bowel sounds heard. Central nervous system: Alert and oriented to self, place, minimally to situation. No focal neurological deficits. Extremities: Symmetric 5 x 5 power. Skin: No rashes, lesions or ulcers Psychiatry: questionable insight. Mood & affect appropriate.     Data Reviewed: I have personally reviewed following labs and imaging studies  CBC: Recent Labs  Lab 06/04/17 1351 06/05/17 0307 06/06/17 0435  WBC 8.0 9.2 7.7  NEUTROABS 5.0  --   --   HGB 13.3 13.3 12.6*  HCT 37.4* 38.1* 35.6*  MCV 86.6 86.8 86.8  PLT 138* 145* 834*   Basic Metabolic Panel: Recent Labs  Lab 06/04/17 1518  NA 140  K 4.5  CL 109  CO2 19*  GLUCOSE 102*  BUN 26*  CREATININE 2.04*  CALCIUM 8.9   GFR: Estimated Creatinine Clearance: 26.7 mL/min (A) (by C-G formula based on SCr of 2.04 mg/dL (H)). Liver Function Tests: No results for input(s): AST, ALT, ALKPHOS, BILITOT, PROT, ALBUMIN in the last 168 hours. No results for input(s): LIPASE, AMYLASE in the last 168 hours. No results for input(s): AMMONIA in the last 168 hours. Coagulation Profile: Recent Labs  Lab 06/04/17 1430  INR 1.58   Cardiac Enzymes: Recent Labs  Lab 06/04/17 2123 06/05/17 0307 06/05/17 0902  TROPONINI 5.39* 8.69* 7.64*   BNP (last 3 results) No results for input(s): PROBNP in the last 8760 hours. HbA1C: Recent Labs    06/04/17 1351    HGBA1C 5.7*   CBG: Recent Labs  Lab 06/05/17 1120 06/05/17 1550 06/05/17 2058 06/06/17 0727 06/06/17 1138  GLUCAP 93 147* 71 78 156*   Lipid Profile: Recent Labs    06/05/17 0307  CHOL 154  HDL 40*  LDLCALC 107*  TRIG 33  CHOLHDL 3.9   Thyroid Function Tests: Recent Labs    06/04/17 1351  TSH 2.517   Anemia Panel: No results for input(s): VITAMINB12, FOLATE, FERRITIN, TIBC, IRON, RETICCTPCT in the last 72 hours. Sepsis Labs: No results for input(s): PROCALCITON, LATICACIDVEN in the last 168 hours.  Recent Results (from the past 240 hour(s))  MRSA PCR Screening     Status: None   Collection Time: 06/04/17  8:22 PM  Result Value Ref Range Status   MRSA by PCR NEGATIVE NEGATIVE Final    Comment:  The GeneXpert MRSA Assay (FDA approved for NASAL specimens only), is one component of a comprehensive MRSA colonization surveillance program. It is not intended to diagnose MRSA infection nor to guide or monitor treatment for MRSA infections.          Radiology Studies: US Venous Img Lower Unilateral Right  Result Date: 06/05/2017 CLINICAL DATA:  Right lower extremity edema. EXAM: RIGHT LOWER EXTREMITY VENOUS DOPPLER ULTRASOUND TECHNIQUE: Gray-scale sonography with graded compression, as well as color Doppler and duplex ultrasound were performed to evaluate the lower extremity deep venous systems from the level of the common femoral vein and including the common femoral, femoral, profunda femoral, popliteal and calf veins including the posterior tibial, peroneal and gastrocnemius veins when visible. The superficial great saphenous vein was also interrogated. Spectral Doppler was utilized to evaluate flow at rest and with distal augmentation maneuvers in the common femoral, femoral and popliteal veins. COMPARISON:  None. FINDINGS: Contralateral Common Femoral Vein: Respiratory phasicity is normal and symmetric with the symptomatic side. No evidence of thrombus.  Normal compressibility. Common Femoral Vein: No evidence of thrombus. Normal compressibility, respiratory phasicity and response to augmentation. Saphenofemoral Junction: No evidence of thrombus. Normal compressibility and flow on color Doppler imaging. Profunda Femoral Vein: No evidence of thrombus. Normal compressibility and flow on color Doppler imaging. Femoral Vein: No evidence of thrombus. Normal compressibility, respiratory phasicity and response to augmentation. Popliteal Vein: No evidence of thrombus. Normal compressibility, respiratory phasicity and response to augmentation. Calf Veins: No evidence of thrombus. Normal compressibility and flow on color Doppler imaging. Superficial Great Saphenous Vein: No evidence of thrombus. Normal compressibility. Venous Reflux:  None. Other Findings: No evidence of superficial thrombophlebitis or abnormal fluid collection. IMPRESSION: No evidence of right lower extremity deep venous thrombosis. Electronically Signed   By: Aletta Edouard M.D.   On: 06/05/2017 10:39        Scheduled Meds: . aspirin EC  81 mg Oral Daily  . atorvastatin  40 mg Oral q1800  . carvedilol  6.25 mg Oral BID WC  . feeding supplement (ENSURE ENLIVE)  237 mL Oral BID BM  . insulin aspart  0-5 Units Subcutaneous QHS  . insulin aspart  0-9 Units Subcutaneous TID WC   Continuous Infusions: . heparin 1,300 Units/hr (06/06/17 0644)     LOS: 2 days    Time spent: 25 minutes    Loretha Stapler, MD [facility] Pager [phone]  If 7PM-7AM, please contact night-coverage [email or web address] Password [facility] 06/06/2017, 2:33 PM

## 2017-06-07 ENCOUNTER — Encounter (HOSPITAL_COMMUNITY): Payer: Self-pay | Admitting: Primary Care

## 2017-06-07 DIAGNOSIS — Z515 Encounter for palliative care: Secondary | ICD-10-CM

## 2017-06-07 DIAGNOSIS — Z7189 Other specified counseling: Secondary | ICD-10-CM

## 2017-06-07 DIAGNOSIS — I483 Typical atrial flutter: Principal | ICD-10-CM

## 2017-06-07 DIAGNOSIS — I429 Cardiomyopathy, unspecified: Secondary | ICD-10-CM

## 2017-06-07 LAB — BASIC METABOLIC PANEL
ANION GAP: 12 (ref 5–15)
BUN: 30 mg/dL — ABNORMAL HIGH (ref 6–20)
CHLORIDE: 105 mmol/L (ref 101–111)
CO2: 21 mmol/L — AB (ref 22–32)
CREATININE: 2.15 mg/dL — AB (ref 0.61–1.24)
Calcium: 8.8 mg/dL — ABNORMAL LOW (ref 8.9–10.3)
GFR calc non Af Amer: 26 mL/min — ABNORMAL LOW (ref >60)
GFR, EST AFRICAN AMERICAN: 30 mL/min — AB (ref >60)
Glucose, Bld: 111 mg/dL — ABNORMAL HIGH (ref 65–99)
Potassium: 3.9 mmol/L (ref 3.5–5.1)
Sodium: 138 mmol/L (ref 135–145)

## 2017-06-07 LAB — CBC
HEMATOCRIT: 34.7 % — AB (ref 39.0–52.0)
Hemoglobin: 12.3 g/dL — ABNORMAL LOW (ref 13.0–17.0)
MCH: 30.6 pg (ref 26.0–34.0)
MCHC: 35.4 g/dL (ref 30.0–36.0)
MCV: 86.3 fL (ref 78.0–100.0)
Platelets: 126 10*3/uL — ABNORMAL LOW (ref 150–400)
RBC: 4.02 MIL/uL — ABNORMAL LOW (ref 4.22–5.81)
RDW: 13.4 % (ref 11.5–15.5)
WBC: 7.8 10*3/uL (ref 4.0–10.5)

## 2017-06-07 LAB — GLUCOSE, CAPILLARY
GLUCOSE-CAPILLARY: 138 mg/dL — AB (ref 65–99)
GLUCOSE-CAPILLARY: 112 mg/dL — AB (ref 65–99)
Glucose-Capillary: 92 mg/dL (ref 65–99)

## 2017-06-07 LAB — HEPARIN LEVEL (UNFRACTIONATED): Heparin Unfractionated: 0.57 IU/mL (ref 0.30–0.70)

## 2017-06-07 MED ORDER — APIXABAN 2.5 MG PO TABS
2.5000 mg | ORAL_TABLET | Freq: Two times a day (BID) | ORAL | Status: DC
  Administered 2017-06-07 – 2017-06-08 (×3): 2.5 mg via ORAL
  Filled 2017-06-07 (×3): qty 1

## 2017-06-07 MED ORDER — HYDRALAZINE HCL 10 MG PO TABS
10.0000 mg | ORAL_TABLET | Freq: Three times a day (TID) | ORAL | Status: DC
  Administered 2017-06-07 – 2017-06-08 (×3): 10 mg via ORAL
  Filled 2017-06-07 (×3): qty 1

## 2017-06-07 NOTE — Progress Notes (Signed)
Progress Note  Patient Name: Curtis Chan Date of Encounter: 06/07/2017  Consulting Cardiologist: Dr. Jenkins Rouge  Subjective   Patient sitting in bed side chair. States that he feels "okay," no chest pain at rest or shortness of breath at rest. No palpitations.  Inpatient Medications    Scheduled Meds: . aspirin EC  81 mg Oral Daily  . atorvastatin  40 mg Oral q1800  . carvedilol  6.25 mg Oral BID WC  . feeding supplement (ENSURE ENLIVE)  237 mL Oral BID BM  . insulin aspart  0-5 Units Subcutaneous QHS  . insulin aspart  0-9 Units Subcutaneous TID WC   Continuous Infusions: . heparin 1,300 Units/hr (06/07/17 0149)   PRN Meds: acetaminophen, ondansetron (ZOFRAN) IV   Vital Signs    Vitals:   06/06/17 1358 06/06/17 2026 06/06/17 2047 06/07/17 0629  BP: (!) 158/84 (!) 107/47  131/63  Pulse: 78 78  72  Resp: 18 18  20   Temp: 98.4 F (36.9 C) 97.6 F (36.4 C)  (!) 97.5 F (36.4 C)  TempSrc: Oral Oral  Oral  SpO2: 98% 97% 97% 97%  Weight:    196 lb 10.4 oz (89.2 kg)  Height:        Intake/Output Summary (Last 24 hours) at 06/07/2017 1018 Last data filed at 06/07/2017 0657 Gross per 24 hour  Intake 419 ml  Output 750 ml  Net -331 ml   Filed Weights   06/05/17 0500 06/06/17 0600 06/07/17 0629  Weight: 187 lb 13.3 oz (85.2 kg) 189 lb 14.4 oz (86.1 kg) 196 lb 10.4 oz (89.2 kg)    Telemetry    Sinus rhythm. PACs. Personally reviewed.  Physical Exam   GEN:  Elderly male. No acute distress.   Neck: No JVD. Cardiac: RRR, 2/6 systolic murmur, no gallop.  Respiratory: Nonlabored. Clear to auscultation bilaterally. GI: Soft, nontender, bowel sounds present. MS: No edema; No deformity. Neuro:  Nonfocal. Psych: Alert and oriented x 3. Normal affect.  Labs    Chemistry Recent Labs  Lab 06/04/17 1518 06/07/17 0429  NA 140 138  K 4.5 3.9  CL 109 105  CO2 19* 21*  GLUCOSE 102* 111*  BUN 26* 30*  CREATININE 2.04* 2.15*  CALCIUM 8.9 8.8*  GFRNONAA  27* 26*  GFRAA 32* 30*  ANIONGAP 12 12     Hematology Recent Labs  Lab 06/05/17 0307 06/06/17 0435 06/07/17 0429  WBC 9.2 7.7 7.8  RBC 4.39 4.10* 4.02*  HGB 13.3 12.6* 12.3*  HCT 38.1* 35.6* 34.7*  MCV 86.8 86.8 86.3  MCH 30.3 30.7 30.6  MCHC 34.9 35.4 35.4  RDW 13.5 13.5 13.4  PLT 145* 125* 126*    Cardiac Enzymes Recent Labs  Lab 06/04/17 2123 06/05/17 0307 06/05/17 0902  TROPONINI 5.39* 8.69* 7.64*    Recent Labs  Lab 06/04/17 1356  TROPIPOC 1.83*     BNP Recent Labs  Lab 06/04/17 1353  BNP 1,263.0*     Radiology    US Venous Img Lower Unilateral Right  Result Date: 06/05/2017 CLINICAL DATA:  Right lower extremity edema. EXAM: RIGHT LOWER EXTREMITY VENOUS DOPPLER ULTRASOUND TECHNIQUE: Gray-scale sonography with graded compression, as well as color Doppler and duplex ultrasound were performed to evaluate the lower extremity deep venous systems from the level of the common femoral vein and including the common femoral, femoral, profunda femoral, popliteal and calf veins including the posterior tibial, peroneal and gastrocnemius veins when visible. The superficial great saphenous vein was also  interrogated. Spectral Doppler was utilized to evaluate flow at rest and with distal augmentation maneuvers in the common femoral, femoral and popliteal veins. COMPARISON:  None. FINDINGS: Contralateral Common Femoral Vein: Respiratory phasicity is normal and symmetric with the symptomatic side. No evidence of thrombus. Normal compressibility. Common Femoral Vein: No evidence of thrombus. Normal compressibility, respiratory phasicity and response to augmentation. Saphenofemoral Junction: No evidence of thrombus. Normal compressibility and flow on color Doppler imaging. Profunda Femoral Vein: No evidence of thrombus. Normal compressibility and flow on color Doppler imaging. Femoral Vein: No evidence of thrombus. Normal compressibility, respiratory phasicity and response to  augmentation. Popliteal Vein: No evidence of thrombus. Normal compressibility, respiratory phasicity and response to augmentation. Calf Veins: No evidence of thrombus. Normal compressibility and flow on color Doppler imaging. Superficial Great Saphenous Vein: No evidence of thrombus. Normal compressibility. Venous Reflux:  None. Other Findings: No evidence of superficial thrombophlebitis or abnormal fluid collection. IMPRESSION: No evidence of right lower extremity deep venous thrombosis. Electronically Signed   By: Aletta Edouard M.D.   On: 06/05/2017 10:39    Cardiac Studies   Echocardiogram 06/05/2017: Study Conclusions  - Left ventricle: The cavity size was normal. Wall thickness was increased in a pattern of mild LVH. The estimated ejection fraction was 20%. Diffuse hypokinesis. There is akinesis of the mid-apicalanteroseptal, inferior, and apical myocardium. Features are consistent with a pseudonormal left ventricular filling pattern, with concomitant abnormal relaxation and increased filling pressure (grade 2 diastolic dysfunction). - Aortic valve: Moderately calcified annulus. Trileaflet; severely calcified leaflets. There was moderate regurgitation. Mean gradient (S): 8 mm Hg. Peak gradient (S): 13 mm Hg. Aortic leaflet excursion is severely reduced. This could reflect severe aortic stenosis with low gradients in the setting of low LVEF, or pseudo-stenosis. VTI ratio of LVOT to aortic valve: 0.51. - Aortic root: The aortic root was mildly dilated. - Mitral valve: Moderately thickened, moderately calcified leaflets . There was mild regurgitation. - Left atrium: The atrium was mildly dilated. - Right ventricle: Systolic function was mildly reduced. - Right atrium: Central venous pressure (est): 15 mm Hg. - Tricuspid valve: There was mild regurgitation. - Pulmonary arteries: PA peak pressure: 41 mm Hg (S). - Pericardium, extracardiac: There was no  pericardial effusion.  Impressions:  - Mild LVH with LVEF approximately 20%. There is diffuse hypokinesis with akinesis of the mid to apical anteroseptal, apical inferior, and apical myocardium. Grade 2 diastolic dysfunction. Mild left atrial enlargement.Moderately thickened and calcified mitral valve with mild mitral regurgitation. Severely calcified aortic valve with moderate aortic regurgitation. Aortic leaflet excursion is severely reduced. This could reflect severe aortic stenosis with low gradients in the setting of low LVEF, or pseudo-stenosis. Mild aortic root dilatation. Mildly reduced right ventricular contraction. Mild tricuspid regurgitation with PASP estimated 41 mmHg.  Patient Profile     83 y.o. male with a history of type 2 diabetes mellitus, CAD stage III, hypertension, previous stroke, and carotid artery disease. He presents with worsening shortness of breath since Saturday, found to be in atrial flutter with variable conduction, and subsequently with enzymatic evidence of NSTEMI.  Assessment & Plan    1. NSTEMI. Peak troponin I 8.69. No recurring chest pain. Heparin is to be stopped today. Invasive workup with cardiac catheterization is not planned following discussion with patient and daughter.  2. Cardiomyopathy with LVEF approximately 20% and also right ventricular dysfunction. Patient has diffuse LV hypokinesis with mid to apical anteroseptal/inferior akinesis. 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  3. Typical atrial flutter with spontaneous conversion to sinus rhythm. CHADSVASC score is 7. Will plan to transition to Eliquis 2.5 mg twice daily when off heparin.  4. Valvular heart disease including possible severe aortic stenosis with low gradients versus pseudo-stenosis in the setting of LV dysfunction.  5. Essential hypertension.  6. CKD stage III,  creatinine 2.15.  7. DNR status. Discussion with palliative care team pending.  Plan to stop heparin infusion today. Continue Coreg and Lipitor, transition to Eliquis 2.5 mg twice daily and stop aspirin. Would not use ARB or ACE inhibitor at this time due to renal insufficiency. Will start low-dose hydralazine, might be able to add low-dose nitrates depending on blood pressure. Do not anticipate further invasive cardiac workup after discussion with patient and his daughter. Overall prognosis is poor with current cardiac comorbidities.  Signed, Rozann Lesches, MD  06/07/2017, 10:18 AM

## 2017-06-07 NOTE — Care Management Note (Signed)
Case Management Note  Patient Details  Name: Curtis Chan MRN: [id number] Date of Birth: May 12, 1926  Subjective/Objective:        Admitted with NSTEMI and severe cardiomyopathy. Pt's grandson at bedside. Asks to record conversation so that wife will know what is going on, CM have permission. Pt is from home, lives with his wife. He reports strong family support. He is ind with ADL's. Reports he drives. Uses a cane PRN. Can not remember if he has a RW but would like to see if he has one at home before one is ordered. He says he goes to his PCP regularly, has been trying to get off of his medications, he talks about insulin and his BP medications. He uses salt on his food, he has a scale but does not weigh himself. 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 We talk about PT recommendations for [facility] PT. Pt is agreeable but only to a few visits, he 'knows they are trying to make a dollar". He has chosen [facility] from list of [facility] providers. Pt understands HH has 48 hrs to make first visit. Per chart, pt will be started on eliquis and hydralazine. Questionable whether pt will adhere to medication regimen.                Action/Plan: DC home with [place], [facility] rep, aware of admission and will pull pt info from chart. CM will follow to DC.   Expected Discharge Date:      06/07/2017            Expected Discharge Plan:  [place]  In-House Referral:  NA  Discharge planning Services  CM Consult  Post Acute Care Choice:  Home Health Choice offered to:  Patient  Status of Service:  In process, will continue to follow  Sherald Barge, RN 06/07/2017, 1:43 PM

## 2017-06-07 NOTE — Consult Note (Signed)
Consultation Note Date: 06/07/2017   Patient Name: Curtis Chan  DOB: 1926-06-05  MRN: [id number]  Age / Sex: 83 y.o., male  PCP: Sharion Balloon, FNP Referring Physician: Eber Jones, MD  Reason for Consultation: Establishing goals of care and Psychosocial/spiritual support  HPI/Patient Profile: 83 y.o. male  with past medical history of diabetes without complications, hypertension, mini stroke, chronic kidney disease stage II, admitted on 06/04/2017 with A. fib/flutter with RVR, N STEMI, heart failure.   Clinical Assessment and Goals of Care: Curtis Chan is sitting quietly in his [place] chair in his room.  He greets me making and keeping eye contact.  He is calm and pleasant.  There is no family at bedside at this time.  Curtis Chan shares his concern over his heart, and his breathing, and he feels like he has gotten worse every day, and may have a very short time to live.  Tells me that he has had a decline over the last 3 weeks in particular, but an overall decline over the last 6 months.  He shares that 1 year ago he weighed 240 pounds.   Curtis Chan shares that he understands his heart is only functioning at 20%.  I share that normal heart function is 60%, he is now at 20%.  I shared that it is NOT 100% and now he is at 20%.  He shares that this makes him feel better.  We talked about the 3 main ways the heart works 1) electrical/A. fib, 2) vascular (like the engine oil in a car) and he has had a heart attack, problems with his blood vessels, 3) the heart as a pump (CHF).  We talked about being short of breath with fluid overload because when the heart does not pump well enough fluid can back up into the lungs making him short of breath.  We talked about low-salt diet, monitoring for fluid overload in the abdomen or legs, possible fluid restriction (if we take pills to pee off fluid, we should not  then drink it back on).  We talked about medical management of his heart problems.  We talked about healthcare power of attorney and advance directives (see below).  We also talked about preferred place of death.  Curtis Chan states that it really does not matter to him where he dies.  He tells a story that his wife, [place], said "you are dying, and I do not want to see that".  He states that she went to visit her son in [place], but should be coming back today.  Curtis Chan tells me that he told his wife that they were both dying, and he did not know which one would go first.  Curtis Chan tells me stories of his supernatural experiences related to his faith.  Curtis Chan also tells me that several years ago he was told that he should take hemodialysis which he declined.  We talked about functional status at home.  Daughter Curtis Chan gets groceries and cooks food mostly, she  also helps manage bill payment.  Curtis Chan is able to shower himself.  He shares that Curtis Chan and daughter Curtis Chan help with housework.  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  This gave him purpose and activity.  We talked about how Curtis Chan will manage at home.  He shares that he is reluctant to have people in his home, he does not want to be bothered.  He states that when his time comes to pass he does not want a big fuss.  He states that if he needs medical help, his wife will call for medical help/911.  I share that having a system in place would be a benefit for him and his family, may be even taking away the need to be hospitalized.  I share that I have a service at no cost, he is already paid for through Medicare, that would send a registered nurse to his house every 2 weeks (depending on his schedule), an aide to help with bathing if/when needed, and other helpers if he so desires.  I shared that they will come do their task and leave, but, if they have problems  they have someone that they can call.  He states that this would possibly interest him.  I share that this is called hospice.  He states his experience has been that people go to hospice and die.  I share that our goal is for hospice to come to him, and hopefully improve his length, and quality of life.  He is still unsure, but agrees to have me call daughter Curtis Chan.   Call to daughter Curtis Chan. We speak about the ways the heart works, we talked about Curtis Chan's heart attack, his atrial fibrillation, his heart failure.  We talked about Curtis Chan previous functional status, need to endorses that she has seen a decline both in functional status and weight loss.  Curtis Chan asks if this shortness of breath will get any better.  I share that it would not be surprising if this is Mr. Cory new normal.  We talked about managing heart failure with medications, fluid restrictions, checking for fluid overload.  Curtis Chan brings up hospice, and we talked about the benefits of hospice at home in detail.  Curtis Chan states that they would request hospice of [facility] call her, Curtis Chan, at [phone], to set up an in-home meeting.  Curtis Chan is tearful at times.  We talked about the recommendation by physical therapy for home health PT.  Curtis Chan states that she will talk with her father about his choices, but states that she understands he may not want physical therapy. She thinks they would choose advanced home health care for physical therapy if her father agrees.  I share that if he does want physical therapy in the home, they can have hospice come in when physical therapy is finished.  We plan for a family meeting tomorrow 12/21 9:30 or 10 AM.  I share that per hospitalist, Mr. Saffran will be allowed to discharge tomorrow if cleared by cardiology.  Healthcare power of attorney NEXT OF KIN  -Mr. Sauls states that his wishes for his daughter, Curtis Chan, to handle his healthcare and financial decisions.  He  has been married to his wife, Aimee Chan for approximately 38 years.  He states that Curtis Chan is his only natural child.  Pearl had 7 (?) children of her own prior to their daughter Curtis Chan.  SUMMARY OF  RECOMMENDATIONS   Likely home with hospice of [facility].   Family meeting 12/21 at 10 AM. Considering starting with home health PT then transition to hospice.  Code Status/Advance Care Planning:  DNR -confirmed to "treat the treatable, but no CPR, no intubation".  We talked about the concept of allowing a natural death.  He shares that his daughter Curtis Chan, knows his wishes, and can abide them.  Symptom Management:   Per hospitalist, no additional needs at this time.  Palliative Prophylaxis:   No additional needs noted at this time.  Additional Recommendations (Limitations, Scope, Preferences):  Treat the treatable, but no CPR, no intubation.  Psycho-social/Spiritual:   Desire for further Chaplaincy support:no  Additional Recommendations: Caregiving  Support/Resources and Education on Hospice  Prognosis:   < 6 months, would not be surprising based on frailty, functional status, Mr. Kopke statement that he feels he does not have long left to live.  In-home hospice services would likely extend quality and time for Mr. Walker.  Discharge Planning: To Be Determined     Primary Diagnoses: Present on Admission: . Atrial flutter with rapid ventricular response ([place]) . Chronic kidney disease (CKD) . Diabetes mellitus type 2 with complications, uncontrolled ([place]) . Hypertension associated with diabetes ([place]) . Edema of right lower extremity   I have reviewed the medical record, interviewed the patient and family, and examined the patient. The following aspects are pertinent.  Past Medical History:  Diagnosis Date  . Cataract   . CKD (chronic kidney disease) stage 3, GFR 30-59 ml/min (HCC)   . Essential hypertension   . History of stroke 2011  . Type 2 diabetes  mellitus ([place])    Social History   Socioeconomic History  . Marital status: Married    Spouse name: None  . Number of children: None  . Years of education: None  . Highest education level: None  Social Needs  . Financial resource strain: None  . Food insecurity - worry: None  . Food insecurity - inability: None  . Transportation needs - medical: None  . Transportation needs - non-medical: None  Occupational History  . None  Tobacco Use  . Smoking status: Former Smoker    Packs/day: 0.25    Years: 35.00    Pack years: 8.75    Types: Cigarettes    Last attempt to quit: 04/25/1983    Years since quitting: 34.1  . Smokeless tobacco: Never Used  Substance and Sexual Activity  . Alcohol use: No  . Drug use: No  . Sexual activity: Yes    Birth control/protection: None  Other Topics Concern  . None  Social History Narrative  . None   Family History  Problem Relation Age of Onset  . Diabetes Sister   . Diabetes Sister   . Diabetes Brother   . Diabetes Brother   . Diabetes Brother   . Diabetes Brother   . Cancer Father    Scheduled Meds: . apixaban  2.5 mg Oral BID  . atorvastatin  40 mg Oral q1800  . carvedilol  6.25 mg Oral BID WC  . feeding supplement (ENSURE ENLIVE)  237 mL Oral BID BM  . hydrALAZINE  10 mg Oral Q8H  . insulin aspart  0-5 Units Subcutaneous QHS  . insulin aspart  0-9 Units Subcutaneous TID WC   Continuous Infusions: PRN Meds:.acetaminophen, ondansetron (ZOFRAN) IV Medications Prior to Admission:  Prior to Admission medications   Medication Sig Start Date End Date Taking? Authorizing Provider  aspirin 81 MG tablet Take 81 mg by mouth daily.   Yes [provider]  glucose blood (FREESTYLE LITE) test strip Use to check blood glucose twice a day.  Dx:  250.03 05/05/13  Yes Cherre Robins, PharmD  Insulin Detemir (LEVEMIR FLEXTOUCH) 100 UNIT/ML Pen Inject 5 Units into the skin daily at 10 pm. 05/14/17  Yes Hawks, Theador Hawthorne, FNP  ULTICARE  MINI PEN NEEDLES 31G X 6 MM MISC USE WITH INSULIN PEN ONCE A DAY. 01/17/17  Yes Hawks, Christy A, FNP   No Known Allergies Review of Systems  Unable to perform ROS: Age    Physical Exam  Constitutional: He is oriented to person, place, and time. No distress.  HENT:  Head: Normocephalic and atraumatic.  Cardiovascular: Normal rate.  Pulmonary/Chest: Effort normal. No respiratory distress.  Abdominal: Soft. He exhibits no distension.  Musculoskeletal: He exhibits no edema.  Good muscle tone for age  Neurological: He is alert and oriented to person, place, and time.  Skin: Skin is warm and dry.  Psychiatric: He has a normal mood and affect. His behavior is normal. Judgment and thought content normal.  Nursing note and vitals reviewed.   Vital Signs: BP 126/74 (BP Location: Right Arm)   Pulse 74   Temp (!) 97.5 F (36.4 C) (Oral)   Resp 20   Ht 5\' 9"  (1.753 m)   Wt 89.2 kg (196 lb 10.4 oz)   SpO2 97%   BMI 29.04 kg/m  Pain Assessment: No/denies pain POSS *See Group Information*: S-Acceptable,Sleep, easy to arouse Pain Score: Asleep   SpO2: SpO2: 97 % O2 Device:SpO2: 97 % O2 Flow Rate: .O2 Flow Rate (L/min): 2 L/min  IO: Intake/output summary:   Intake/Output Summary (Last 24 hours) at 06/07/2017 1526 Last data filed at 06/07/2017 1015 Gross per 24 hour  Intake 659 ml  Output 750 ml  Net -91 ml    LBM: Last BM Date: 06/06/17 Baseline Weight: Weight: 87.1 kg (192 lb) Most recent weight: Weight: 89.2 kg (196 lb 10.4 oz)     Palliative Assessment/Data:   Flowsheet Rows     Most Recent Value  Intake Tab  Referral Department  Hospitalist  Unit at Time of Referral  Cardiac/Telemetry Unit  Palliative Care Primary Diagnosis  Cardiac  Date Notified  06/06/17  Palliative Care Type  New Palliative care  Reason for referral  Clarify Goals of Care, Counsel Regarding Hospice  Date of Admission  06/04/17  Date first seen by Palliative Care  06/07/17  # of days Palliative  referral response time  1 Day(s)  # of days IP prior to Palliative referral  2  Clinical Assessment  Palliative Performance Scale Score  50%  Pain Max last 24 hours  Not able to report  Pain Min Last 24 hours  Not able to report  Dyspnea Max Last 24 Hours  Not able to report  Dyspnea Min Last 24 hours  Not able to report  Psychosocial & Spiritual Assessment  Palliative Care Outcomes  Palliative Care Outcomes  Counseled regarding hospice, Clarified goals of care, Provided psychosocial or spiritual support  Patient/Family wishes: Interventions discontinued/not started   Mechanical Ventilation      Time In: 1445 Time Out: 1605 Time Total: 80 minutes Greater than 50%  of this time was spent counseling and coordinating care related to the above assessment and plan.  Signed by: Drue Novel, NP   Please contact Palliative Medicine Team phone at [phone] for questions and concerns.  For individual provider: See Shea Evans

## 2017-06-07 NOTE — Progress Notes (Signed)
PROGRESS NOTE    Curtis Chan  RCV:[id number] DOB: 1926-02-10 DOA: 06/04/2017 PCP: Sharion Balloon, FNP   Brief Narrative:  Curtis Chan is a 83 y.o. male with medical history significant of TIA; HTN; and DM presenting with weakness and SOB.  Hias daughter reports that he has been in bad shape since Saturday AM - SOB, no appetite, not drinking much, very weak.  It did not improve but he wanted to see his PCP and so they went today and were sent to the ER.  No CP, no palpitations.  SOB was with any exertion.  He continues to be SOB intermittently just with attempt at conversation.  Some intermittent SOB even with resting.  Unable to lie flat, had to sleep sitting up.  Years ago he was admitted at [facility] for an issue with his heart but his daughter is not certain what the problem was since it was so long ago.  He reports having had a heart attack about 15 years ago with catheterization, no intervention.  Denies h/o taking anticoagulation.    He took himself off medication for his BP about 2-3 months ago.    Overnight from 06/04/2017 to 06/05/2017 troponin increased dramatically up to 8.69.  Cardiology fellow was phoned in patient as he is a DNR and has renal insufficiency was kept at [facility] for further evaluation by cardiology in a.m. Medical management of NSTEMI.   Assessment & Plan:   Principal Problem:   Atrial flutter with rapid ventricular response (HCC) Active Problems:   Diabetes mellitus type 2 with complications, uncontrolled ([place])   Hypertension associated with diabetes (HCC)   Chronic kidney disease (CKD)   Acute CHF (congestive heart failure) (HCC)   Edema of right lower extremity   NSTEMI (non-ST elevated myocardial infarction) ([place])  NSTEMI - medical management given patients CKD and patient preference to hold off on invasive testing at this time -Plan to stop heparin today -Continue Coreg and Lipitor -Transition to Eliquis 2.5 mg twice a day and stop  aspirin -Overall poor prognosis giving significant cardiomyopathy  A fib/flutter with RVR -Transition to Eliquis 2.5 mg twice daily -Chads vas score is 7  RLE edema -Lower extremity ultrasound negative for DVT  CHF -Ejection fraction of 20% -We will consult palliative care -Overall poor prognosis given cardiac comorbidities   DM -A1c 5.7 -Continue Levemir -Cover with sensitive-scale SSI  HTN - blood pressure controlled at this time  CKD -Appears to be stable, stage 3 CKD -Creatinine of 2.15 which is upper limits of patient's normal.     DVT prophylaxis: Heparin drip Code Status: DNR Family Communication: No family is bedside Disposition Plan: Likely discharge tomorrow if cleared by cardiology   Consultants:   Cardiology  Procedures:   None  Antimicrobials:   None   Subjective: Patient seen this morning.  Discussed his overall prognosis as well as his heart disease.  Discussed his recent echocardiogram findings with an ejection fraction of 20%.  Discussed that palliative care has been consulted.  Patient voices understanding and asked appropriate questions.  He also asked that he not be bathed today.  Objective: Vitals:   06/06/17 2026 06/06/17 2047 06/07/17 0629 06/07/17 1152  BP: (!) 107/47  131/63   Pulse: 78  72   Resp: 18  20   Temp: 97.6 F (36.4 C)  (!) 97.5 F (36.4 C)   TempSrc: Oral  Oral   SpO2: 97% 97% 97% 97%  Weight:   89.2 kg (  196 lb 10.4 oz)   Height:        Intake/Output Summary (Last 24 hours) at 06/07/2017 1349 Last data filed at 06/07/2017 1015 Gross per 24 hour  Intake 659 ml  Output 1050 ml  Net -391 ml   Filed Weights   06/05/17 0500 06/06/17 0600 06/07/17 0629  Weight: 85.2 kg (187 lb 13.3 oz) 86.1 kg (189 lb 14.4 oz) 89.2 kg (196 lb 10.4 oz)    Examination:  General exam: Appears calm and comfortable, elderly male Respiratory system: Clear to auscultation. Respiratory effort normal. Cardiovascular system:  S1 & S2 heard, RRR. II/VI systolic murmur; no rubs or gallops appreciated, no edema bilaterally of lower extremity Gastrointestinal system: Abdomen is nondistended, soft and nontender. No organomegaly or masses felt. Normal bowel sounds heard. Central nervous system: Alert and oriented x3 Extremities: Symmetric 5 x 5 power. Skin: No rashes, lesions or ulcers Psychiatry: Patient asked appropriate questions when discussing heart disease. Mood & affect appropriate.     Data Reviewed: I have personally reviewed following labs and imaging studies  CBC: Recent Labs  Lab 06/04/17 1351 06/05/17 0307 06/06/17 0435 06/07/17 0429  WBC 8.0 9.2 7.7 7.8  NEUTROABS 5.0  --   --   --   HGB 13.3 13.3 12.6* 12.3*  HCT 37.4* 38.1* 35.6* 34.7*  MCV 86.6 86.8 86.8 86.3  PLT 138* 145* 125* 222*   Basic Metabolic Panel: Recent Labs  Lab 06/04/17 1518 06/07/17 0429  NA 140 138  K 4.5 3.9  CL 109 105  CO2 19* 21*  GLUCOSE 102* 111*  BUN 26* 30*  CREATININE 2.04* 2.15*  CALCIUM 8.9 8.8*   GFR: Estimated Creatinine Clearance: 25.7 mL/min (A) (by C-G formula based on SCr of 2.15 mg/dL (H)). Liver Function Tests: No results for input(s): AST, ALT, ALKPHOS, BILITOT, PROT, ALBUMIN in the last 168 hours. No results for input(s): LIPASE, AMYLASE in the last 168 hours. No results for input(s): AMMONIA in the last 168 hours. Coagulation Profile: Recent Labs  Lab 06/04/17 1430  INR 1.58   Cardiac Enzymes: Recent Labs  Lab 06/04/17 2123 06/05/17 0307 06/05/17 0902  TROPONINI 5.39* 8.69* 7.64*   BNP (last 3 results) No results for input(s): PROBNP in the last 8760 hours. HbA1C: Recent Labs    06/04/17 1351  HGBA1C 5.7*   CBG: Recent Labs  Lab 06/06/17 0727 06/06/17 1138 06/06/17 1647 06/06/17 2025 06/07/17 0757  GLUCAP 78 156* 113* 172* 92   Lipid Profile: Recent Labs    06/05/17 0307  CHOL 154  HDL 40*  LDLCALC 107*  TRIG 33  CHOLHDL 3.9   Thyroid Function  Tests: Recent Labs    06/04/17 1351  TSH 2.517   Anemia Panel: No results for input(s): VITAMINB12, FOLATE, FERRITIN, TIBC, IRON, RETICCTPCT in the last 72 hours. Sepsis Labs: No results for input(s): PROCALCITON, LATICACIDVEN in the last 168 hours.  Recent Results (from the past 240 hour(s))  MRSA PCR Screening     Status: None   Collection Time: 06/04/17  8:22 PM  Result Value Ref Range Status   MRSA by PCR NEGATIVE NEGATIVE Final    Comment:        The GeneXpert MRSA Assay (FDA approved for NASAL specimens only), is one component of a comprehensive MRSA colonization surveillance program. It is not intended to diagnose MRSA infection nor to guide or monitor treatment for MRSA infections.          Radiology Studies: No results found.  Scheduled Meds: . apixaban  2.5 mg Oral BID  . atorvastatin  40 mg Oral q1800  . carvedilol  6.25 mg Oral BID WC  . feeding supplement (ENSURE ENLIVE)  237 mL Oral BID BM  . hydrALAZINE  10 mg Oral Q8H  . insulin aspart  0-5 Units Subcutaneous QHS  . insulin aspart  0-9 Units Subcutaneous TID WC   Continuous Infusions:    LOS: 3 days    Time spent: 25 minutes    Loretha Stapler, MD [facility] Pager [phone]  If 7PM-7AM, please contact night-coverage [email or web address] Password [email or web address] 06/07/2017, 1:49 PM

## 2017-06-07 NOTE — Progress Notes (Signed)
Physical Therapy Treatment Patient Details Name: Curtis Chan MRN: [id number] DOB: 1926-06-02 Today's Date: 06/07/2017    History of Present Illness Curtis Chan is a 83 y.o. male with medical history significant of TIA; HTN; and DM presenting with weakness and SOB.  Hias daughter reports that he has been in bad shape since Saturday AM - SOB, no appetite, not drinking much, very weak.  It did not improve but he wanted to see his PCP and so they went today and were sent to the ER.  No CP, no palpitations.  SOB was with any exertion.  He continues to be SOB intermittently just with attempt at conversation.  Some intermittent SOB even with resting.  Unable to lie flat, had to sleep sitting up.  Years ago he was admitted at [facility] for an issue with his heart but his daughter is not certain what the problem was since it was so long ago.  He reports having had a heart attack about 15 years ago with catheterization, no intervention.  Denies h/o taking anticoagulation.     PT Comments    Patient demonstrates good return for use of cane for gait training without loss of balance, limited for gait due c/o of fatigue, O2 sats at 99% after exercises and walking and continued sitting up in chair after therapy.  Patient will benefit from continued physical therapy in hospital and recommended venue below to increase strength, balance, endurance for safe ADLs and gait.   Follow Up Recommendations  Home health PT;Supervision - Intermittent     Equipment Recommendations  None recommended by PT    Recommendations for Other Services       Precautions / Restrictions Precautions Precautions: Fall Restrictions Weight Bearing Restrictions: No    Mobility  Bed Mobility               General bed mobility comments: Patient presents sitting in chair  Transfers Overall transfer level: Modified independent Equipment used: Straight cane             General transfer comment: has to push off  armrest of chair  Ambulation/Gait Ambulation/Gait assistance: Supervision Ambulation Distance (Feet): 65 Feet Assistive device: Straight cane Gait Pattern/deviations: Step-through pattern;WFL(Within Functional Limits)   Gait velocity interpretation: Below normal speed for age/gender General Gait Details: grossly WFL without loss of balance, demonstrates slow slightly labored cadence with mostly 2 point gait pattern using SPC, limited secondary to c/o fatigue   Stairs            Wheelchair Mobility    Modified Rankin (Stroke Patients Only)       Balance Overall balance assessment: Needs assistance Sitting-balance support: No upper extremity supported;Feet supported Sitting balance-Leahy Scale: Good     Standing balance support: Single extremity supported;During functional activity Standing balance-Leahy Scale: Fair Standing balance comment: fair/good using single point cane (SPC)                            Cognition Arousal/Alertness: Awake/alert Behavior During Therapy: WFL for tasks assessed/performed Overall Cognitive Status: Within Functional Limits for tasks assessed                                        Exercises General Exercises - Lower Extremity Long Arc Quad: Seated;AROM;Strengthening;Both;10 reps Hip Flexion/Marching: Seated;AROM;Strengthening;Both;10 reps Toe Raises: Seated;AROM;Strengthening;Both;10 reps Heel Raises: Seated;AROM;Strengthening;Both;10 reps  General Comments        Pertinent Vitals/Pain Pain Assessment: No/denies pain    Home Living                      Prior Function            PT Goals (current goals can now be found in the care plan section) Acute Rehab PT Goals Patient Stated Goal: return home PT Goal Formulation: With patient Time For Goal Achievement: 06/08/17 Potential to Achieve Goals: Good Progress towards PT goals: Progressing toward goals    Frequency    Min  3X/week      PT Plan Current plan remains appropriate    Co-evaluation              AM-PAC PT "6 Clicks" Daily Activity  Outcome Measure  Difficulty turning over in bed (including adjusting bedclothes, sheets and blankets)?: None Difficulty moving from lying on back to sitting on the side of the bed? : None Difficulty sitting down on and standing up from a chair with arms (e.g., wheelchair, bedside commode, etc,.)?: None Help needed moving to and from a bed to chair (including a wheelchair)?: None Help needed walking in hospital room?: A Little Help needed climbing 3-5 steps with a railing? : A Little 6 Click Score: 22    End of Session   Activity Tolerance: Patient tolerated treatment well;Patient limited by fatigue Patient left: in chair;with call bell/phone within reach;with family/visitor present Nurse Communication: Mobility status PT Visit Diagnosis: Unsteadiness on feet (R26.81);Other abnormalities of gait and mobility (R26.89);Muscle weakness (generalized) (M62.81)     Time: 1103-1594 PT Time Calculation (min) (ACUTE ONLY): 24 min  Charges:  $Gait Training: 8-22 mins $Therapeutic Exercise: 8-22 mins                    G Codes:       9:53 AM, 06/23/17 Lonell Grandchild, MPT Physical Therapist with [facility] 336 [phone] office [phone] mobile phone

## 2017-06-07 NOTE — Progress Notes (Signed)
ANTICOAGULATION CONSULT NOTE   Pharmacy Consult for heparin Indication: atrial fibrillation  No Known Allergies  Patient Measurements: Height: 5\' 9"  (175.3 cm) Weight: 196 lb 10.4 oz (89.2 kg) IBW/kg (Calculated) : 70.7 HEPARIN DW (KG): 86.4  Vital Signs: Temp: 97.5 F (36.4 C) (12/20 0629) Temp Source: Oral (12/20 0629) BP: 131/63 (12/20 0629) Pulse Rate: 72 (12/20 0629)  Labs: Recent Labs    06/04/17 1430 06/04/17 1518  06/04/17 2123 06/05/17 0307 06/05/17 0902 06/05/17 1515 06/06/17 0435 06/07/17 0429  HGB  --   --   --   --  13.3  --   --  12.6* 12.3*  HCT  --   --   --   --  38.1*  --   --  35.6* 34.7*  PLT  --   --   --   --  145*  --   --  125* 126*  LABPROT 18.7*  --   --   --   --   --   --   --   --   INR 1.58  --   --   --   --   --   --   --   --   HEPARINUNFRC  --   --    < > 0.37 0.28*  --  0.37 0.62 0.57  CREATININE  --  2.04*  --   --   --   --   --   --  2.15*  TROPONINI  --   --   --  5.39* 8.69* 7.64*  --   --   --    < > = values in this interval not displayed.   Estimated Creatinine Clearance: 25.7 mL/min (A) (by C-G formula based on SCr of 2.15 mg/dL (H)).  Assessment: 83 y.o. male presenting for Shortness of Breath and Poor Appetite, admitted with Atrial fib. Pharmacy asked to start heparin. Troponin elevated.  Plan supportive care.  Heparin level remains therapeutic today.  PLTC stable: 126K.  Goal of Therapy:  Heparin level 0.3-0.7 units/ml Monitor platelets by anticoagulation protocol: Yes   Plan:  Continue heparin drip at 1300 units/hr Monitor CBC, daily heparin level.  Pricilla Larsson, [facility]  06/07/2017,8:36 AM

## 2017-06-08 DIAGNOSIS — I255 Ischemic cardiomyopathy: Secondary | ICD-10-CM | POA: Diagnosis present

## 2017-06-08 DIAGNOSIS — I5021 Acute systolic (congestive) heart failure: Secondary | ICD-10-CM | POA: Diagnosis present

## 2017-06-08 DIAGNOSIS — I5041 Acute combined systolic (congestive) and diastolic (congestive) heart failure: Secondary | ICD-10-CM

## 2017-06-08 DIAGNOSIS — E1165 Type 2 diabetes mellitus with hyperglycemia: Secondary | ICD-10-CM

## 2017-06-08 DIAGNOSIS — Z515 Encounter for palliative care: Secondary | ICD-10-CM

## 2017-06-08 DIAGNOSIS — E1159 Type 2 diabetes mellitus with other circulatory complications: Secondary | ICD-10-CM

## 2017-06-08 LAB — GLUCOSE, CAPILLARY
GLUCOSE-CAPILLARY: 92 mg/dL (ref 65–99)
Glucose-Capillary: 169 mg/dL — ABNORMAL HIGH (ref 65–99)

## 2017-06-08 LAB — CBC
HCT: 32.1 % — ABNORMAL LOW (ref 39.0–52.0)
Hemoglobin: 11.6 g/dL — ABNORMAL LOW (ref 13.0–17.0)
MCH: 30.5 pg (ref 26.0–34.0)
MCHC: 36.1 g/dL — AB (ref 30.0–36.0)
MCV: 84.5 fL (ref 78.0–100.0)
PLATELETS: 130 10*3/uL — AB (ref 150–400)
RBC: 3.8 MIL/uL — ABNORMAL LOW (ref 4.22–5.81)
RDW: 12.8 % (ref 11.5–15.5)
WBC: 7.7 10*3/uL (ref 4.0–10.5)

## 2017-06-08 MED ORDER — ATORVASTATIN CALCIUM 40 MG PO TABS: 40.0000 mg | ORAL_TABLET | Freq: Every day | ORAL | 0 refills | Status: DC

## 2017-06-08 MED ORDER — APIXABAN 2.5 MG PO TABS
2.5000 mg | ORAL_TABLET | Freq: Two times a day (BID) | ORAL | 0 refills | Status: DC
Start: 2017-06-08 — End: 2017-07-09

## 2017-06-08 MED ORDER — ISOSORBIDE MONONITRATE ER 30 MG PO TB24: 30.0000 mg | ORAL_TABLET | Freq: Every day | ORAL | 0 refills | Status: AC

## 2017-06-08 MED ORDER — ENSURE ENLIVE PO LIQD: 237.0000 mL | Freq: Two times a day (BID) | ORAL | 12 refills | Status: AC

## 2017-06-08 MED ORDER — CARVEDILOL 6.25 MG PO TABS: 6.2500 mg | ORAL_TABLET | Freq: Two times a day (BID) | ORAL | 0 refills | Status: DC

## 2017-06-08 MED ORDER — HYDRALAZINE HCL 10 MG PO TABS: 10.0000 mg | ORAL_TABLET | Freq: Three times a day (TID) | ORAL | 0 refills | Status: DC

## 2017-06-08 NOTE — Care Management Note (Addendum)
Case Management Note  Patient Details  Name: Curtis Chan MRN: [id number] Date of Birth: 11-26-1925  Expected Discharge Date:     06/08/2017             Expected Discharge Plan:  Home w Hospice Care  In-House Referral:  Hospice / Palliative Care  Discharge planning Services  CM Consult  Post Acute Care Choice:  Hospice Choice offered to:  Adult Children  HH Arranged:  RN [place] Agency:  [facility]  Status of Service:  Completed, signed off  Additional Comments: Discharging today. Pt/family have changed DC plan to home with Hospice. They have [facility]. CM has verified preference with dtr and faxed referral to [place]. Blake Divine, [facility] rep, aware of change to DC plan, referral for [facility] PT has been cancelled.   Sherald Barge, RN 06/08/2017, 10:52 AM

## 2017-06-08 NOTE — Care Management Important Message (Signed)
Important Message  Patient Details  Name: FULTON MERRY MRN: [id number] Date of Birth: 1925/11/06   Medicare Important Message Given:  Yes    Sherald Barge, RN 06/08/2017, 10:54 AM

## 2017-06-08 NOTE — Progress Notes (Signed)
Physical Therapy Treatment Patient Details Name: Curtis Chan MRN: [id number] DOB: March 29, 1926 Today's Date: 06/08/2017    History of Present Illness Family received information regarding hospice this am. HTN, DM, SOB, TIA, decline in function and appetite.    PT Comments    Patient tolerated treatment well today although limited by shortness of breath and fatigue; able to ambulate with [facility] further than last treatment. Overall transferred and followed directions well. Patient would continue to benefit from skilled physical therapy in current environment and next venue to continue return to prior function and increase strength, endurance, balance, coordination, and functional mobility and gait skills.       Follow Up Recommendations  Home health PT;Supervision - Intermittent     Equipment Recommendations  None recommended by PT    Recommendations for Other Services       Precautions / Restrictions Precautions Precautions: Fall Restrictions Weight Bearing Restrictions: No    Mobility  Bed Mobility Overal bed mobility: Independent             General bed mobility comments: Patient presents sitting in chair  Transfers Overall transfer level: Modified independent Equipment used: Straight cane(personal)             General transfer comment: has to push off armrest of chair  Ambulation/Gait Ambulation/Gait assistance: Supervision Ambulation Distance (Feet): 80 Feet   Gait Pattern/deviations: Step-through pattern;WFL(Within Functional Limits)   Gait velocity interpretation: Below normal speed for age/gender General Gait Details: grossly WFL without loss of balance, demonstrates slow slightly labored cadence with mostly 2 point gait pattern using SPC, limited secondary to c/o fatigue   Stairs            Wheelchair Mobility    Modified Rankin (Stroke Patients Only)       Balance Overall balance assessment: Needs assistance Sitting-balance support:  No upper extremity supported;Feet supported Sitting balance-Leahy Scale: Good     Standing balance support: Single extremity supported;During functional activity(thorugh SPC) Standing balance-Leahy Scale: Fair                              Cognition Arousal/Alertness: Awake/alert Behavior During Therapy: WFL for tasks assessed/performed Overall Cognitive Status: Within Functional Limits for tasks assessed                                        Exercises General Exercises - Lower Extremity Ankle Circles/Pumps: Seated;AROM;Strengthening;Both;10 reps Long Arc Quad: Seated;AROM;Strengthening;Both;10 reps Hip Flexion/Marching: Seated;AROM;Strengthening;Both;10 reps Shoulder Exercises Shoulder Flexion: Seated;AROM;Strengthening;Both;10 reps    General Comments        Pertinent Vitals/Pain Pain Assessment: No/denies pain    Home Living Family/patient expects to be discharged to:: Private residence Living Arrangements: Spouse/significant other Available Help at Discharge: Family(wife and 2 daughters) Type of Home: House Home Access: Level entry   Home Layout: One level Home Equipment: Environmental consultant - 2 wheels;Cane - single point      Prior Function Level of Independence: Independent          PT Goals (current goals can now be found in the care plan section) Acute Rehab PT Goals Patient Stated Goal: return home PT Goal Formulation: With patient/family Time For Goal Achievement: 06/09/17 Potential to Achieve Goals: Good    Frequency    Min 3X/week      PT Plan      Co-evaluation  AM-PAC PT "6 Clicks" Daily Activity  Outcome Measure  Difficulty turning over in bed (including adjusting bedclothes, sheets and blankets)?: None Difficulty moving from lying on back to sitting on the side of the bed? : None Difficulty sitting down on and standing up from a chair with arms (e.g., wheelchair, bedside commode, etc,.)?: None Help  needed moving to and from a bed to chair (including a wheelchair)?: None Help needed walking in hospital room?: A Little Help needed climbing 3-5 steps with a railing? : A Little 6 Click Score: 22    End of Session   Activity Tolerance: Patient tolerated treatment well;Patient limited by fatigue Patient left: in chair;with family/visitor present   PT Visit Diagnosis: Unsteadiness on feet (R26.81);Other abnormalities of gait and mobility (R26.89);Muscle weakness (generalized) (M62.81)     Time: 2025-4270 PT Time Calculation (min) (ACUTE ONLY): 25 min  Charges:  $Gait Training: 8-22 mins $Therapeutic Exercise: 8-22 mins                    G Codes:  Functional Assessment Tool Used: AM-PAC 6 Clicks Basic Mobility Functional Limitation: Mobility: Walking and moving around Mobility: Walking and Moving Around Current Status (W2376): At least 20 percent but less than 40 percent impaired, limited or restricted Mobility: Walking and Moving Around Goal Status [phone]): At least 20 percent but less than 40 percent impaired, limited or restricted Mobility: Walking and Moving Around Discharge Status [phone]): At least 20 percent but less than 40 percent impaired, limited or restricted    Floria Raveling. Hartnett-Rands, MS, PT Per [place] [phone]

## 2017-06-08 NOTE — Progress Notes (Signed)
IVs removed, patient tolerated well, 2x2 gauze applied to sites, reviewed discharge information with patient and patient's daughter, both verbalized understanding. Patient transported home via daughter.

## 2017-06-08 NOTE — ACP (Advance Care Planning) (Signed)
Mr. Curtis Chan states he would like to allow a natural death (DNR).  Healthcare surrogate is daughter, Donetta Potts.  He has no preferred place of death.

## 2017-06-08 NOTE — Progress Notes (Addendum)
Pt had 5 beats run of Vtach. MD is aware.

## 2017-06-08 NOTE — Care Management (Signed)
Patient Information   Patient Name Curtis Chan, Curtis Chan ([id number]) Sex Male DOB 10/05/1925  Room Bed  A323 A323-01  Patient Demographics   Address [place] [address] Phone [phone] (Home)  Patient Ethnicity & Race   Ethnic Group Patient Race  Not Hispanic or Latino Black or African American  Emergency Contact(s)   Name Relation Home Work Mobile  Coffelt,Pearl Spouse [phone]    price,tenita Daughter   [phone]  Documents on File    Status Date Received Description  Documents for the Patient  Home E-Signature HIPAA Notice of Privacy Received 04/08/13   [place] E-Signature HIPAA Notice of Privacy [place] Received 03/17/13   [place] HIPAA NOTICE OF PRIVACY - Scanned Not Received    Driver's License Not Received    Insurance Card Not Received    Advance Directives/Living Will/HCPOA/POA Not Received    Advanced Beneficiary Notice (ABN) Not Received    Driver's License Received 42/68/34 [facility]  Insurance Card Received 03/19/13 [facility]/[employer]  Release of Information Received 19/62/22 [facility]  VVS Policy for Pain - E Signature Not Received    Insurance Card Not Received    Insurance Card Not Received    HIM ROI Authorization  09/09/13   E-Signature AOB Spanish Not Received    Insurance Card Received 04/05/17 [facility]/AARP  Other Photo ID Not Received    AMB Correspondence  09/10/15   Documents for the Encounter  AOB (Assignment of Insurance Benefits) Not Received    E-signature AOB Signed 06/04/17   MEDICARE RIGHTS Not Received    E-signature Medicare Rights Signed 06/04/17   ED Patient Billing Extract   ED PB Billing Extract  Ultrasound Received 06/05/17   Cardiac Monitoring Strip Received 06/05/17   Cardiac Monitoring Strip Shift Summary Received 06/05/17   EKG Received 06/05/17   Admission Information   Attending Provider Admitting Provider Admission Type Admission Date/Time  Louellen Molder, MD Karmen Bongo, MD Emergency  06/04/17 1316  Discharge Date Hospital Service Auth/Cert Status Service Area   Internal Medicine Incomplete [place]  Unit Room/Bed Admission Status   AP-DEPT 300 A323/A323-01 Admission (Confirmed)   Admission   Complaint  abnormal [facility] Account   Name Acct ID Class Status Primary Coverage  Alisha, Bur[place] [id number] Inpatient [place]      Guarantor Account (for Hospital Account [id number])   Name Relation to Pt Service Area Active? Acct Type  [place] Self CHSA Yes Personal/Family  Address Phone    [address] [place], [place] [address] [phone])        Coverage Information (for Hospital Account [id number])   F/O Payor/Plan Precert #  [facility] [place] #  [place], [place] [id number]  Address [place] [address] [address] [phone]

## 2017-06-08 NOTE — Discharge Summary (Signed)
Physician Discharge Summary  Curtis Chan ZOX:[id number] DOB: 1925-07-02 DOA: 06/04/2017  PCP: Sharion Balloon, FNP  Admit date: 06/04/2017 Discharge date: 06/08/2017  Admitted From: Home Disposition:  Home with referral to hospice of [facility]   Recommendations for Outpatient Follow-up:  1. Follow up with PCP in 1-2 weeks 2. Please obtain BMP/CBC in one week 3. Please follow up on the following pending results:  Home Health:none Equipment/Devices:None  Discharge Condition:fair CODE STATUS:DO NOT RESUSCITATE Diet recommendation: Heart Healthy / Carb Modified     Discharge Diagnoses:  Principal Problem:   Atrial flutter with rapid ventricular response (HCC)   Active Problems:   NSTEMI (non-ST elevated myocardial infarction) ([place])   Acute Combined systolic And diastolic CHF (congestive heart failure) ([place])   Diabetes mellitus type 2 with complications, uncontrolled ([place])   Hypertension associated with diabetes ([place])   Chronic kidney disease (CKD)   Edema of right lower extremity   Palliative care encounter   Goals of care, counseling/discussion   Encounter for hospice care discussion   Ischemic cardiomyopathy  Brief narrative/history of present illness Please for to admission H&P for details, in brief, 83 year old male wit history of TIA, hypertension and diabetes mellitus presented with weakness and increasing shortness of breath Worsened on minimal exertion and associated with orthopnea. Patient was found to be in rapid A. Fib in the ED and started on Cardizem drip and IV heparin. He was found to have progressively elevated troponin initially thought to be due to demand ischemia but was secondary to NSTEMI. 2-D echo with severely reduced EF and hypokinesis.  Hospital course Atrial fibrillation with rapid ventricular response (HCC) Heart rate controlled with Cardizem drip and now transitioned to oral carvedilol twice daily. Transition to Eliquis 2.5 mg bid (  CHADS2vasc score of 7) . Discontinued aspirin.  Non-ST elevation MI Given his chronic kidney disease cartilage is recommended medical management. IV heparin discontinued. Started on beta blocker and statin. Transition to eliquis. Overall prognosis is poor given severe cardiomyopathy. Cardiology agreed with palliative care consult and transition to hospice.  Ischemic cardiomyopathy/ Acute Combined systolic And diastolic CHF 2-D echo with EF of 20% Diffuse hypokinesis, mild LVH and grade 2 diastolic dysfunction. Appears euvolemic on exam, has not required Diuretics. Added beta blocker, statin, hydralazine and Imdur.  Chronic kidney disease stage III Stable at baseline.  Type 2 diabetes mellitus, uncontrolled with hyperglycemia Continue home dose insulin.   Goals of care Palliative care consult appreciated. Overall prognosis is guarded with severe  Cardiomyopathy. Discussed With patient and family and they are inclined towards going home with home hospice follow-up. Patient would like to be hospitalized for any acute illness in the future.  Home hospice referral has been made.   Procedure: 2-D echo Consults: Cardiology, palliative care   family communication: Daughter and son-in-law at bedside  Disposition: Home   Discharge Instructions   Allergies as of 06/08/2017   No Known Allergies     Medication List    STOP taking these medications   aspirin 81 MG tablet     TAKE these medications   apixaban 2.5 MG Tabs tablet Commonly known as:  ELIQUIS Take 1 tablet (2.5 mg total) by mouth 2 (two) times daily.   atorvastatin 40 MG tablet Commonly known as:  LIPITOR Take 1 tablet (40 mg total) by mouth daily at 6 PM.   carvedilol 6.25 MG tablet Commonly known as:  COREG Take 1 tablet (6.25 mg total) by mouth 2 (two) times daily with a  meal.   feeding supplement (ENSURE ENLIVE) Liqd Take 237 mLs by mouth 2 (two) times daily between meals.   glucose blood test  strip Commonly known as:  FREESTYLE LITE Use to check blood glucose twice a day.  Dx:  250.03   hydrALAZINE 10 MG tablet Commonly known as:  APRESOLINE Take 1 tablet (10 mg total) by mouth every 8 (eight) hours.   Insulin Detemir 100 UNIT/ML Pen Commonly known as:  LEVEMIR FLEXTOUCH Inject 5 Units into the skin daily at 10 pm.   isosorbide mononitrate 30 MG 24 hr tablet Commonly known as:  IMDUR Take 1 tablet (30 mg total) by mouth daily.   ULTICARE MINI PEN NEEDLES 31G X 6 MM Misc Generic drug:  Insulin Pen Needle USE WITH INSULIN PEN ONCE A DAY.      Follow-up Information    Sharion Balloon, FNP. Schedule an appointment as soon as possible for a visit in 1 week(s).   Specialty:  Family Medicine Contact information: [place] [address] [phone]          No Known Allergies    Procedures/Studies: US Venous Img Lower Unilateral Right  Result Date: 06/05/2017 CLINICAL DATA:  Right lower extremity edema. EXAM: RIGHT LOWER EXTREMITY VENOUS DOPPLER ULTRASOUND TECHNIQUE: Gray-scale sonography with graded compression, as well as color Doppler and duplex ultrasound were performed to evaluate the lower extremity deep venous systems from the level of the common femoral vein and including the common femoral, femoral, profunda femoral, popliteal and calf veins including the posterior tibial, peroneal and gastrocnemius veins when visible. The superficial great saphenous vein was also interrogated. Spectral Doppler was utilized to evaluate flow at rest and with distal augmentation maneuvers in the common femoral, femoral and popliteal veins. COMPARISON:  None. FINDINGS: Contralateral Common Femoral Vein: Respiratory phasicity is normal and symmetric with the symptomatic side. No evidence of thrombus. Normal compressibility. Common Femoral Vein: No evidence of thrombus. Normal compressibility, respiratory phasicity and response to augmentation. Saphenofemoral  Junction: No evidence of thrombus. Normal compressibility and flow on color Doppler imaging. Profunda Femoral Vein: No evidence of thrombus. Normal compressibility and flow on color Doppler imaging. Femoral Vein: No evidence of thrombus. Normal compressibility, respiratory phasicity and response to augmentation. Popliteal Vein: No evidence of thrombus. Normal compressibility, respiratory phasicity and response to augmentation. Calf Veins: No evidence of thrombus. Normal compressibility and flow on color Doppler imaging. Superficial Great Saphenous Vein: No evidence of thrombus. Normal compressibility. Venous Reflux:  None. Other Findings: No evidence of superficial thrombophlebitis or abnormal fluid collection. IMPRESSION: No evidence of right lower extremity deep venous thrombosis. Electronically Signed   By: Aletta Edouard M.D.   On: 06/05/2017 10:39   Dg Chest Portable 1 View  Result Date: 06/04/2017 CLINICAL DATA:  Acute shortness of breath EXAM: PORTABLE CHEST 1 VIEW COMPARISON:  None. FINDINGS: Cardiomegaly and mild pulmonary vascular congestion noted. There is no evidence of focal airspace disease, pulmonary edema, suspicious pulmonary nodule/mass, pleural effusion, or pneumothorax. No acute bony abnormalities are identified. IMPRESSION: Cardiomegaly with mild pulmonary vascular congestion. Electronically Signed   By: Margarette Canada M.D.   On: 06/04/2017 14:13    2-D echo Study Conclusions  - Left ventricle: The cavity size was normal. Wall thickness was   increased in a pattern of mild LVH. The estimated ejection   fraction was 20%. Diffuse hypokinesis. There is akinesis of the   mid-apicalanteroseptal, inferior, and apical myocardium. Features   are consistent with a pseudonormal  left ventricular filling   pattern, with concomitant abnormal relaxation and increased   filling pressure (grade 2 diastolic dysfunction). - Aortic valve: Moderately calcified annulus. Trileaflet; severely    calcified leaflets. There was moderate regurgitation. Mean   gradient (S): 8 mm Hg. Peak gradient (S): 13 mm Hg. Aortic   leaflet excursion is severely reduced. This could reflect severe   aortic stenosis with low gradients in the setting of low LVEF, or   pseudo-stenosis. VTI ratio of LVOT to aortic valve: 0.51. - Aortic root: The aortic root was mildly dilated. - Mitral valve: Moderately thickened, moderately calcified leaflets   . There was mild regurgitation. - Left atrium: The atrium was mildly dilated. - Right ventricle: Systolic function was mildly reduced. - Right atrium: Central venous pressure (est): 15 mm Hg. - Tricuspid valve: There was mild regurgitation. - Pulmonary arteries: PA peak pressure: 41 mm Hg (S). - Pericardium, extracardiac: There was no pericardial effusion.  Impressions:  - Mild LVH with LVEF approximately 20%. There is diffuse   hypokinesis with akinesis of the mid to apical anteroseptal,   apical inferior, and apical myocardium. Grade 2 diastolic   dysfunction. Mild left atrial enlargement.Moderately thickened   and calcified mitral valve with mild mitral regurgitation.   Severely calcified aortic valve with moderate aortic   regurgitation. Aortic leaflet excursion is severely reduced. This   could reflect severe aortic stenosis with low gradients in the   setting of low LVEF, or pseudo-stenosis. Mild aortic root   dilatation. Mildly reduced right ventricular contraction. Mild   tricuspid regurgitation with PASP estimated 41 mmHg.   Subjective: Denies any shortness of breath or chest discomfort.  Discharge Exam: Vitals:   06/07/17 2151 06/08/17 0552  BP: 128/78 (!) 157/81  Pulse: 68 80  Resp: 20 20  Temp: 98.2 F (36.8 C) 98.3 F (36.8 C)  SpO2: 100% 100%   Vitals:   06/07/17 1400 06/07/17 1704 06/07/17 2151 06/08/17 0552  BP: 126/74 138/81 128/78 (!) 157/81  Pulse: 74 77 68 80  Resp:   20 20  Temp:   98.2 F (36.8 C) 98.3 F (36.8 C)   TempSrc:   Oral Oral  SpO2:  100% 100% 100%  Weight:    89.2 kg (196 lb 10.8 oz)  Height:        General: Elderly male not in distress HEENT: Moist mucosa, supple neck Chest: Clear to auscultation bilaterally, no added sound  CVS: S1 and S2 irregular, 3/6 systolic murmur GI: Soft, nondistended, nontender Musculoskeletal: Warm, trace Pedal edema CNS: Alert and oriented      The results of significant diagnostics from this hospitalization (including imaging, microbiology, ancillary and laboratory) are listed below for reference.     Microbiology: Recent Results (from the past 240 hour(s))  MRSA PCR Screening     Status: None   Collection Time: 06/04/17  8:22 PM  Result Value Ref Range Status   MRSA by PCR NEGATIVE NEGATIVE Final    Comment:        The GeneXpert MRSA Assay (FDA approved for NASAL specimens only), is one component of a comprehensive MRSA colonization surveillance program. It is not intended to diagnose MRSA infection nor to guide or monitor treatment for MRSA infections.      Labs: BNP (last 3 results) Recent Labs    06/04/17 1353  BNP 2,297.9*   Basic Metabolic Panel: Recent Labs  Lab 06/04/17 1518 06/07/17 0429  NA 140 138  K 4.5 3.9  CL  109 105  CO2 19* 21*  GLUCOSE 102* 111*  BUN 26* 30*  CREATININE 2.04* 2.15*  CALCIUM 8.9 8.8*   Liver Function Tests: No results for input(s): AST, ALT, ALKPHOS, BILITOT, PROT, ALBUMIN in the last 168 hours. No results for input(s): LIPASE, AMYLASE in the last 168 hours. No results for input(s): AMMONIA in the last 168 hours. CBC: Recent Labs  Lab 06/04/17 1351 06/05/17 0307 06/06/17 0435 06/07/17 0429 06/08/17 0436  WBC 8.0 9.2 7.7 7.8 7.7  NEUTROABS 5.0  --   --   --   --   HGB 13.3 13.3 12.6* 12.3* 11.6*  HCT 37.4* 38.1* 35.6* 34.7* 32.1*  MCV 86.6 86.8 86.8 86.3 84.5  PLT 138* 145* 125* 126* 130*   Cardiac Enzymes: Recent Labs  Lab 06/04/17 2123 06/05/17 0307 06/05/17 0902   TROPONINI 5.39* 8.69* 7.64*   BNP: Invalid input(s): POCBNP CBG: Recent Labs  Lab 06/07/17 0757 06/07/17 1653 06/07/17 2145 06/08/17 0735 06/08/17 1137  GLUCAP 92 138* 112* 92 169*   D-Dimer No results for input(s): DDIMER in the last 72 hours. Hgb A1c No results for input(s): HGBA1C in the last 72 hours. Lipid Profile No results for input(s): CHOL, HDL, LDLCALC, TRIG, CHOLHDL, LDLDIRECT in the last 72 hours. Thyroid function studies No results for input(s): TSH, T4TOTAL, T3FREE, THYROIDAB in the last 72 hours.  Invalid input(s): FREET3 Anemia work up No results for input(s): VITAMINB12, FOLATE, FERRITIN, TIBC, IRON, RETICCTPCT in the last 72 hours. Urinalysis    Component Value Date/Time   APPEARANCEUR Clear 01/11/2017 1722   GLUCOSEU Negative 01/11/2017 1722   BILIRUBINUR Negative 01/11/2017 1722   PROTEINUR 2+ (A) 01/11/2017 1722   UROBILINOGEN negative 04/07/2014 1629   NITRITE Negative 01/11/2017 1722   LEUKOCYTESUR Negative 01/11/2017 1722   Sepsis Labs Invalid input(s): PROCALCITONIN,  WBC,  LACTICIDVEN Microbiology Recent Results (from the past 240 hour(s))  MRSA PCR Screening     Status: None   Collection Time: 06/04/17  8:22 PM  Result Value Ref Range Status   MRSA by PCR NEGATIVE NEGATIVE Final    Comment:        The GeneXpert MRSA Assay (FDA approved for NASAL specimens only), is one component of a comprehensive MRSA colonization surveillance program. It is not intended to diagnose MRSA infection nor to guide or monitor treatment for MRSA infections.      Time coordinating discharge: Over 30 minutes  SIGNED:   Louellen Molder, MD  [facility] 06/08/2017, 12:19 PM Pager   If 7PM-7AM, please contact night-coverage [email or web address] Password [email or web address]

## 2017-06-08 NOTE — Progress Notes (Signed)
Daily Progress Note   Patient Name: Curtis Chan       Date: 06/08/2017 DOB: [birth year]  Age: 83 y.o. MRN#: [id number] Attending Physician: Louellen Molder, MD Primary Care Physician: Sharion Balloon, FNP Admit Date: 06/04/2017  Reason for Consultation/Follow-up: Establishing goals of care, Hospice Evaluation and Psychosocial/spiritual support  Subjective: Curtis Chan is sitting in his [place] chair in his room.  He greets me making and keeping eye contact.  He is calm, cooperative and pleasant.  Present today at bedside is daughter Donetta Potts and her husband Laverna Peace.  Mr. Melucci states that he feels better today, his daughter endorses that she feels he looks better.  We talked at length about Curtis Chan's heart problems including the 3 main ways the heart works 1) electrical/A. Melburn Hake, 2) vascular (like the engine oil in a car) and he has had a heart attack, problems with his blood vessels, 3) the heart as a pump (CHF).  We talked about being short of breath with fluid overload because when the heart does not pump well enough fluid can back up into the lungs making him short of breath.  We talk in detail about heart failure management including low-salt diet, monitoring for fluid overload in the abdomen or legs (I show need how to press Curtis Chan's legs to see if he has swelling), possible fluid restriction (if we take pills to pee off fluid, we should not then drink it back on).  I share that this is sometimes a balance in the heart needs to be dry, but the kidneys need to be wet. We talked about medical management of his heart problems.  We talked about medications including Eliquis blood thinner, Lipitor, and Coreg.  I encouraged family to work with local hospice provider about medications, but  he will likely need to stay on blood thinner and Coreg at the minimum.  I shared that they should also discuss this with cardiology for their outpatient follow-up appointment.    We talked about managing home life.  I share that hospice will come in and do tasks and then leave.  I encouraged Nita to work with family onset days and times and tasks.  We talked about this in detail encouraging her to realize that sometimes men are not comfortable seeing the changes that occur with frailty  and also with their role as caregivers, but with gentle support she can assist them in caring for Curtis Chan at home.   We talked about the benefits of home health versus hospice in detail.  I share diagram showing services as time goes along.  Mr. Welchel states that he does not feel he wants physical therapy.  I share that he can do exercises at home, I continue to encourage him to do the most he can.  Laverta Baltimore and Laverna Peace also agree that hospice in-home services would fit their needs best.  They elect [facility] of [facility] [place] for in-home services, NOT full comfort care.    We talked about the use of as needed fluid pills to help reduce fluid volume overload.  I encourage Curtis Chan to check weight daily or check for fluid overload with breathing, tighter belt, or fluid in the legs.  We talked about calling hospice for questions or concerns, leaning on them for support of disease management, with the goal of treat at home, not in the hospital.  All questions answered, emotional support provided, Nita encouraged to call as needed.  Length of Stay: 4  Current Medications: Scheduled Meds:  . apixaban  2.5 mg Oral BID  . atorvastatin  40 mg Oral q1800  . carvedilol  6.25 mg Oral BID WC  . feeding supplement (ENSURE ENLIVE)  237 mL Oral BID BM  . hydrALAZINE  10 mg Oral Q8H  . insulin aspart  0-5 Units Subcutaneous QHS  . insulin aspart  0-9 Units Subcutaneous TID WC    Continuous Infusions:   PRN  Meds: acetaminophen, ondansetron (ZOFRAN) IV  Physical Exam  Constitutional: He is oriented to person, place, and time. No distress.  Appears somewhat deconditioned, calm and pleasant, makes and keeps eye contact  HENT:  Head: Atraumatic.  Some temporal wasting, slight  Cardiovascular: Normal rate and regular rhythm.  Pulmonary/Chest: Effort normal. No respiratory distress.  Abdominal: Soft. He exhibits no distension.  Musculoskeletal:  Mild edema right lower leg  Neurological: He is alert and oriented to person, place, and time.  Skin: Skin is warm and dry.  Psychiatric: He has a normal mood and affect. His behavior is normal. Judgment and thought content normal.  Nursing note and vitals reviewed.           Vital Signs: BP (!) 157/81 (BP Location: Right Arm)   Pulse 80   Temp 98.3 F (36.8 C) (Oral)   Resp 20   Ht 5\' 9"  (1.753 m)   Wt 89.2 kg (196 lb 10.8 oz)   SpO2 100%   BMI 29.04 kg/m  SpO2: SpO2: 100 % O2 Device: O2 Device: Not Delivered O2 Flow Rate: O2 Flow Rate (L/min): 2 L/min  Intake/output summary:   Intake/Output Summary (Last 24 hours) at 06/08/2017 1111 Last data filed at 06/08/2017 2355 Gross per 24 hour  Intake -  Output 300 ml  Net -300 ml   LBM: Last BM Date: 06/07/17 Baseline Weight: Weight: 87.1 kg (192 lb) Most recent weight: Weight: 89.2 kg (196 lb 10.8 oz)       Palliative Assessment/Data:    Flowsheet Rows     Most Recent Value  Intake Tab  Referral Department  Hospitalist  Unit at Time of Referral  Cardiac/Telemetry Unit  Palliative Care Primary Diagnosis  Cardiac  Date Notified  06/06/17  Palliative Care Type  New Palliative care  Reason for referral  Clarify Goals of Care, Counsel Regarding Hospice  Date  of Admission  06/04/17  Date first seen by Palliative Care  06/07/17  # of days Palliative referral response time  1 Day(s)  # of days IP prior to Palliative referral  2  Clinical Assessment  Palliative Performance Scale  Score  50%  Pain Max last 24 hours  Not able to report  Pain Min Last 24 hours  Not able to report  Dyspnea Max Last 24 Hours  Not able to report  Dyspnea Min Last 24 hours  Not able to report  Psychosocial & Spiritual Assessment  Palliative Care Outcomes  Palliative Care Outcomes  Counseled regarding hospice, Clarified goals of care, Provided psychosocial or spiritual support  Patient/Family wishes: Interventions discontinued/not started   Mechanical Ventilation      Patient Active Problem List   Diagnosis Date Noted  . Palliative care encounter   . Goals of care, counseling/discussion   . Encounter for hospice care discussion   . NSTEMI (non-ST elevated myocardial infarction) ([place]) 06/05/2017  . Atrial flutter with rapid ventricular response ([place]) 06/04/2017  . Acute CHF (congestive heart failure) ([place]) 06/04/2017  . Edema of right lower extremity 06/04/2017  . Type 2 diabetes mellitus with diabetic chronic kidney disease ([place]) 05/14/2017  . Chronic kidney disease (CKD) 05/14/2017  . Noncompliance 04/05/2017  . BPH (benign prostatic hyperplasia) 07/22/2014  . PVD (peripheral vascular disease) ([place]) 03/31/2014  . Diabetes mellitus type 2 with complications, uncontrolled ([place]) 05/05/2013  . Hypertension associated with diabetes ([place]) 05/05/2013  . Atherosclerosis of native arteries of the extremities with ulceration ([place]) 04/25/2013    Palliative Care Assessment & Plan   Patient Profile: 83 y.o. male  with past medical history of diabetes without complications, hypertension, mini stroke, chronic kidney disease stage II, admitted on 06/04/2017 with A. fib/flutter with RVR, N STEMI, heart failure.  Assessment: A. fib/flutter with RVR, N STEMI, heart failure:   Recommendations/Plan:  Home with the benefits of hospice of [facility]  NOT full comfort care  Goals of Care and Additional Recommendations:  Limitations on Scope of Treatment: Treat the treatable, no CPR, no  intubation, home with hospice  Code Status:    Code Status Orders  (From admission, onward)        Start     Ordered   06/04/17 2024  Do not attempt resuscitation (DNR)  Continuous    Question Answer Comment  In the event of cardiac or respiratory ARREST Do not call a "code blue"   In the event of cardiac or respiratory ARREST Do not perform Intubation, CPR, defibrillation or ACLS   In the event of cardiac or respiratory ARREST Use medication by any route, position, wound care, and other measures to relive pain and suffering. May use oxygen, suction and manual treatment of airway obstruction as needed for comfort.      06/04/17 2023    Code Status History    Date Active Date Inactive Code Status Order ID Comments User Context   This patient has a current code status but no historical code status.    Advance Directive Documentation     Most Recent Value  Type of Advance Directive  Healthcare Power of Attorney  Pre-existing out of facility DNR order (yellow form or pink MOST form)  No data  "MOST" Form in Place?  No data       Prognosis:   < 6 months would not be surprising based on frailty, functional status, Mr. Mathes statement that he feels he does not have  long left to live.  In-home hospice services (not full comfort care) would likely extend quality and time for Mr. Kiang.  Discharge Planning:  Patient and family are requesting hospice of [place] was discussed with nursing staff, case management, social worker, and Dr. Clementeen Graham.   Thank you for allowing the Palliative Medicine Team to assist in the care of this patient.   Time In:  0950 Time Out:  1110 Total Time  80 minutes Prolonged Time Billed  yes       Greater than 50%  of this time was spent counseling and coordinating care related to the above assessment and plan.  Drue Novel, NP  Please contact Palliative Medicine Team phone at [phone] for questions and concerns.

## 2017-06-09 ENCOUNTER — Encounter (HOSPITAL_COMMUNITY): Payer: Self-pay

## 2017-06-09 NOTE — Progress Notes (Unsigned)
Patient's daughter, Donetta Potts, called to state patient was to take Levemir at bedtime but had no current prescription. Dr. Clementeen Graham not available today. Dr. Carles Collet approved Levemir Touch pen 5 units to be injected daily into skin at 10pm to be called in to patient's pharmacy.  [facility] called and order given to [facility]. Ms. March Rummage notified of prescription call in.

## 2017-06-15 ENCOUNTER — Encounter: Payer: Self-pay | Admitting: Family

## 2017-06-15 ENCOUNTER — Ambulatory Visit (INDEPENDENT_AMBULATORY_CARE_PROVIDER_SITE_OTHER): Admitting: Family

## 2017-06-15 VITALS — BP 168/88 | HR 82 | Temp 97.7°F | Ht 69.0 in | Wt 193.2 lb

## 2017-06-15 DIAGNOSIS — I4891 Unspecified atrial fibrillation: Secondary | ICD-10-CM | POA: Diagnosis not present

## 2017-06-15 DIAGNOSIS — I214 Non-ST elevation (NSTEMI) myocardial infarction: Secondary | ICD-10-CM

## 2017-06-15 DIAGNOSIS — E1122 Type 2 diabetes mellitus with diabetic chronic kidney disease: Secondary | ICD-10-CM

## 2017-06-15 DIAGNOSIS — I10 Essential (primary) hypertension: Secondary | ICD-10-CM

## 2017-06-15 DIAGNOSIS — Z9119 Patient's noncompliance with other medical treatment and regimen: Secondary | ICD-10-CM

## 2017-06-15 DIAGNOSIS — E1159 Type 2 diabetes mellitus with other circulatory complications: Secondary | ICD-10-CM

## 2017-06-15 DIAGNOSIS — N183 Chronic kidney disease, stage 3 unspecified: Secondary | ICD-10-CM

## 2017-06-15 DIAGNOSIS — Z91199 Patient's noncompliance with other medical treatment and regimen due to unspecified reason: Secondary | ICD-10-CM

## 2017-06-15 DIAGNOSIS — Z09 Encounter for follow-up examination after completed treatment for conditions other than malignant neoplasm: Secondary | ICD-10-CM

## 2017-06-15 DIAGNOSIS — Z794 Long term (current) use of insulin: Secondary | ICD-10-CM | POA: Diagnosis not present

## 2017-06-15 LAB — CBC WITH DIFFERENTIAL/PLATELET
BASOS: 0 %
Basophils Absolute: 0 10*3/uL (ref 0.0–0.2)
EOS (ABSOLUTE): 0.5 10*3/uL — AB (ref 0.0–0.4)
Eos: 6 %
Hematocrit: 33.7 % — ABNORMAL LOW (ref 37.5–51.0)
Hemoglobin: 12.2 g/dL — ABNORMAL LOW (ref 13.0–17.7)
IMMATURE GRANULOCYTES: 0 %
Immature Grans (Abs): 0 10*3/uL (ref 0.0–0.1)
Lymphocytes Absolute: 1.8 10*3/uL (ref 0.7–3.1)
Lymphs: 21 %
MCH: 30.5 pg (ref 26.6–33.0)
MCHC: 36.2 g/dL — ABNORMAL HIGH (ref 31.5–35.7)
MCV: 84 fL (ref 79–97)
MONOS ABS: 0.6 10*3/uL (ref 0.1–0.9)
Monocytes: 8 %
NEUTROS ABS: 5.4 10*3/uL (ref 1.4–7.0)
NEUTROS PCT: 65 %
PLATELETS: 152 10*3/uL (ref 150–379)
RBC: 4 x10E6/uL — ABNORMAL LOW (ref 4.14–5.80)
RDW: 13.6 % (ref 12.3–15.4)
WBC: 8.3 10*3/uL (ref 3.4–10.8)

## 2017-06-15 LAB — BMP8+EGFR
BUN/Creatinine Ratio: 12 (ref 10–24)
BUN: 23 mg/dL (ref 8–27)
CO2: 22 mmol/L (ref 20–29)
Calcium: 9.3 mg/dL (ref 8.6–10.2)
Chloride: 106 mmol/L (ref 96–106)
Creatinine, Ser: 1.93 mg/dL — ABNORMAL HIGH (ref 0.76–1.27)
GFR calc Af Amer: 35 mL/min/{1.73_m2} — ABNORMAL LOW (ref >59)
GFR, EST NON AFRICAN AMERICAN: 30 mL/min/{1.73_m2} — AB (ref >59)
Glucose: 93 mg/dL (ref 65–99)
POTASSIUM: 4.6 mmol/L (ref 3.5–5.2)
SODIUM: 142 mmol/L (ref 134–144)

## 2017-06-15 LAB — BAYER DCA HB A1C WAIVED: HB A1C (BAYER DCA - WAIVED): 6.5 % (ref <7.0)

## 2017-06-15 NOTE — Progress Notes (Signed)
Subjective:    Patient ID: Curtis Chan, male    DOB: Jan 21, 1926, 83 y.o.   MRN: [id number]  Pt presents to the office today for hospital follow up. Pt went to the ED on 06/04/17 for weakness and SOB and found to have new onset of A Fib and NSTEMI.   PT was discharged on Elquis, Coreg, and Lipitor. PT has over the last 6 months been noncompliance with his medications and "trying to stop all medications".  Pt is currently having Hospice coming twice a week.   Hypertension  This is a chronic problem. The current episode started more than 1 year ago. The problem has been waxing and waning since onset. The problem is uncontrolled. Associated symptoms include peripheral edema. Pertinent negatives include no shortness of breath. The current treatment provides mild improvement. Hypertensive end-organ damage includes CAD/MI, CVA and heart failure.  Diabetes  He presents for his follow-up diabetic visit. He has type 2 diabetes mellitus. Associated symptoms include weakness. Symptoms are stable. Diabetic complications include a CVA.      Review of Systems  Respiratory: Negative for shortness of breath.   Neurological: Positive for weakness.  All other systems reviewed and are negative.      Objective:   Physical Exam  Constitutional: He is oriented to person, place, and time. He appears well-developed and well-nourished. No distress.  HENT:  Head: Normocephalic.  Eyes: Pupils are equal, round, and reactive to light. Right eye exhibits no discharge. Left eye exhibits no discharge.  Neck: Normal range of motion. Neck supple. No thyromegaly present.  Cardiovascular: Normal rate, regular rhythm and intact distal pulses.  Murmur heard. Pulmonary/Chest: Effort normal and breath sounds normal. No respiratory distress. He has no wheezes.  Abdominal: Soft. Bowel sounds are normal. He exhibits no distension. There is no tenderness.  Musculoskeletal: Normal range of motion. He exhibits edema (2+  BLE). He exhibits no tenderness.  Neurological: He is alert and oriented to person, place, and time.  Skin: Skin is warm and dry. No rash noted. No erythema.  Psychiatric: He has a normal mood and affect. His behavior is normal. Judgment and thought content normal.  Vitals reviewed.     BP (!) 168/88   Pulse 82   Temp 97.7 F (36.5 C) (Oral)   Ht '5\' 9"'  (1.753 m)   Wt 193 lb 3.2 oz (87.6 kg)   BMI 28.53 kg/m      Assessment & Plan:  1. Hypertension associated with diabetes ([place]) - Ambulatory referral to Cardiology - BMP8+EGFR - CBC with Differential/Platelet  2. NSTEMI (non-ST elevated myocardial infarction) [facility]) - Ambulatory referral to Cardiology - BMP8+EGFR - CBC with Differential/Platelet  3. Noncompliance - Ambulatory referral to Cardiology - BMP8+EGFR - CBC with Differential/Platelet  4. Stage 3 chronic kidney disease ([place]) - Ambulatory referral to Cardiology - BMP8+EGFR - CBC with Differential/Platelet  5. Type 2 diabetes mellitus with stage 3 chronic kidney disease, with long-term current use of insulin ([place]) - Ambulatory referral to Cardiology - BMP8+EGFR - CBC with Differential/Platelet - Bayer DCA Hb A1c Waived  6. Atrial fibrillation, unspecified type ([place]) - Ambulatory referral to Cardiology - BMP8+EGFR - CBC with Differential/Platelet  7. Hospital discharge follow-up - Ambulatory referral to Cardiology - BMP8+EGFR - CBC with Differential/Platelet   PT seems to be compliance with his medications today. His daughter is here today and states she has been making sure he takes his medications daily! Pt states he will see a Cardiologists at least once,  referral pending Labs pending Health Maintenance reviewed Diet and exercise encouraged RTO 3 months   Evelina Dun, FNP

## 2017-06-15 NOTE — Patient Instructions (Signed)
Heart Failure Heart failure is a condition in which the heart has trouble pumping blood because it has become weak or stiff. This means that the heart does not pump blood efficiently for the body to work well. For some people with heart failure, fluid may back up into the lungs and there may be swelling (edema) in the lower legs. Heart failure is usually a long-term (chronic) condition. It is important for you to take good care of yourself and follow the treatment plan from your health care provider. What are the causes? This condition is caused by some health problems, including:  High blood pressure (hypertension). Hypertension causes the heart muscle to work harder than normal. High blood pressure eventually causes the heart to become stiff and weak.  Coronary artery disease (CAD). CAD is the buildup of cholesterol and fat (plaques) in the arteries of the heart.  Heart attack (myocardial infarction). Injured tissue, which is caused by the heart attack, does not contract as well and the heart's ability to pump blood is weakened.  Abnormal heart valves. When the heart valves do not open and close properly, the heart muscle must pump harder to keep the blood flowing.  Heart muscle disease (cardiomyopathy or myocarditis). Heart muscle disease is damage to the heart muscle from a variety of causes, such as drug or alcohol abuse, infections, or unknown causes. These can increase the risk of heart failure.  Lung disease. When the lungs do not work properly, the heart must work harder.  What increases the risk? Risk of heart failure increases as a person ages. This condition is also more likely to develop in people who:  Are overweight.  Are male.  Smoke or chew tobacco.  Abuse alcohol or illegal drugs.  Have taken medicines that can damage the heart, such as chemotherapy drugs.  Have diabetes. ? High blood sugar (glucose) is associated with high fat (lipid) levels in the blood. ? Diabetes  can also damage tiny blood vessels that carry nutrients to the heart muscle.  Have abnormal heart rhythms.  Have thyroid problems.  Have low blood counts (anemia).  What are the signs or symptoms? Symptoms of this condition include:  Shortness of breath with activity, such as when climbing stairs.  Persistent cough.  Swelling of the feet, ankles, legs, or abdomen.  Unexplained weight gain.  Difficulty breathing when lying flat (orthopnea).  Waking from sleep because of the need to sit up and get more air.  Rapid heartbeat.  Fatigue and loss of energy.  Feeling light-headed, dizzy, or close to fainting.  Loss of appetite.  Nausea.  Increased urination during the night (nocturia).  Confusion.  How is this diagnosed? This condition is diagnosed based on:  Medical history, symptoms, and a physical exam.  Diagnostic tests, which may include: ? Echocardiogram. ? Electrocardiogram (ECG). ? Chest X-ray. ? Blood tests. ? Exercise stress test. ? Radionuclide scans. ? Cardiac catheterization and angiogram.  How is this treated? Treatment for this condition is aimed at managing the symptoms of heart failure. Medicines, behavioral changes, or other treatments may be necessary to treat heart failure. Medicines These may include:  Angiotensin-converting enzyme (ACE) inhibitors. This type of medicine blocks the effects of a blood protein called angiotensin-converting enzyme. ACE inhibitors relax (dilate) the blood vessels and help to lower blood pressure.  Angiotensin receptor blockers (ARBs). This type of medicine blocks the actions of a blood protein called angiotensin. ARBs dilate the blood vessels and help to lower blood pressure.  Water   pills (diuretics). Diuretics cause the kidneys to remove salt and water from the blood. The extra fluid is removed through urination, leaving a lower volume of blood that the heart has to pump.  Beta blockers. These improve heart  muscle strength and they prevent the heart from beating too quickly.  Digoxin. This increases the force of the heartbeat.  Healthy behavior changes These may include:  Reaching and maintaining a healthy weight.  Stopping smoking or chewing tobacco.  Eating heart-healthy foods.  Limiting or avoiding alcohol.  Stopping use of street drugs (illegal drugs).  Physical activity.  Other treatments These may include:  Surgery to open blocked coronary arteries or repair damaged heart valves.  Placement of a biventricular pacemaker to improve heart muscle function (cardiac resynchronization therapy). This device paces both the right ventricle and left ventricle.  Placement of a device to treat serious abnormal heart rhythms (implantable cardioverter defibrillator, or ICD).  Placement of a device to improve the pumping ability of the heart (left ventricular assist device, or LVAD).  Heart transplant. This can cure heart failure, and it is considered for certain patients who do not improve with other therapies.  Follow these instructions at home: Medicines  Take over-the-counter and prescription medicines only as told by your health care provider. Medicines are important in reducing the workload of your heart, slowing the progression of heart failure, and improving your symptoms. ? Do not stop taking your medicine unless your health care provider told you to do that. ? Do not skip any dose of medicine. ? Refill your prescriptions before you run out of medicine. You need your medicines every day. Eating and drinking   Eat heart-healthy foods. Talk with a dietitian to make an eating plan that is right for you. ? Choose foods that contain no trans fat and are low in saturated fat and cholesterol. Healthy choices include fresh or frozen fruits and vegetables, fish, lean meats, legumes, fat-free or low-fat dairy products, and whole-grain or high-fiber foods. ? Limit salt (sodium) if  directed by your health care provider. Sodium restriction may reduce symptoms of heart failure. Ask a dietitian to recommend heart-healthy seasonings. ? Use healthy cooking methods instead of frying. Healthy methods include roasting, grilling, broiling, baking, poaching, steaming, and stir-frying.  Limit your fluid intake if directed by your health care provider. Fluid restriction may reduce symptoms of heart failure. Lifestyle  Stop smoking or using chewing tobacco. Nicotine and tobacco can damage your heart and your blood vessels. Do not use nicotine gum or patches before talking to your health care provider.  Limit alcohol intake to no more than 1 drink per day for non-pregnant women and 2 drinks per day for men. One drink equals 12 oz of beer, 5 oz of wine, or 1 oz of hard liquor. ? Drinking more than that is harmful to your heart. Tell your health care provider if you drink alcohol several times a week. ? Talk with your health care provider about whether any level of alcohol use is safe for you. ? If your heart has already been damaged by alcohol or you have severe heart failure, drinking alcohol should be stopped completely.  Stop use of illegal drugs.  Lose weight if directed by your health care provider. Weight loss may reduce symptoms of heart failure.  Do moderate physical activity if directed by your health care provider. People who are elderly and people with severe heart failure should consult with a health care provider for physical activity recommendations.   Monitor important information  Weigh yourself every day. Keeping track of your weight daily helps you to notice excess fluid sooner. ? Weigh yourself every morning after you urinate and before you eat breakfast. ? Wear the same amount of clothing each time you weigh yourself. ? Record your daily weight. Provide your health care provider with your weight record.  Monitor and record your blood pressure as told by your health  care provider.  Check your pulse as told by your health care provider. Dealing with extreme temperatures  If the weather is extremely hot: ? Avoid vigorous physical activity. ? Use air conditioning or fans or seek a cooler location. ? Avoid caffeine and alcohol. ? Wear loose-fitting, lightweight, and light-colored clothing.  If the weather is extremely cold: ? Avoid vigorous physical activity. ? Layer your clothes. ? Wear mittens or gloves, a hat, and a scarf when you go outside. ? Avoid alcohol. General instructions  Manage other health conditions such as hypertension, diabetes, thyroid disease, or abnormal heart rhythms as told by your health care provider.  Learn to manage stress. If you need help to do this, ask your health care provider.  Plan rest periods when fatigued.  Get ongoing education and support as needed.  Participate in or seek rehabilitation as needed to maintain or improve independence and quality of life.  Stay up to date with immunizations. Keeping current on pneumococcal and influenza immunizations is especially important to prevent respiratory infections.  Keep all follow-up visits as told by your health care provider. This is important. Contact a health care provider if:  You have a rapid weight gain.  You have increasing shortness of breath that is unusual for you.  You are unable to participate in your usual physical activities.  You tire easily.  You cough more than normal, especially with physical activity.  You have any swelling or more swelling in areas such as your hands, feet, ankles, or abdomen.  You are unable to sleep because it is hard to breathe.  You feel like your heart is beating quickly (palpitations).  You become dizzy or light-headed when you stand up. Get help right away if:  You have difficulty breathing.  You notice or your family notices a change in your awareness, such as having trouble staying awake or having  difficulty with concentration.  You have pain or discomfort in your chest.  You have an episode of fainting (syncope). This information is not intended to replace advice given to you by your health care provider. Make sure you discuss any questions you have with your health care provider. Document Released: 06/05/2005 Document Revised: 02/08/2016 Document Reviewed: 12/29/2015 Elsevier Interactive Patient Education  2018 Elsevier Inc[employer]  

## 2017-06-25 ENCOUNTER — Other Ambulatory Visit: Payer: Self-pay | Admitting: Family

## 2017-06-25 ENCOUNTER — Other Ambulatory Visit: Payer: Self-pay | Admitting: *Deleted

## 2017-06-26 ENCOUNTER — Ambulatory Visit (INDEPENDENT_AMBULATORY_CARE_PROVIDER_SITE_OTHER)

## 2017-06-26 DIAGNOSIS — N189 Chronic kidney disease, unspecified: Secondary | ICD-10-CM

## 2017-06-26 DIAGNOSIS — R531 Weakness: Secondary | ICD-10-CM

## 2017-06-26 DIAGNOSIS — E118 Type 2 diabetes mellitus with unspecified complications: Secondary | ICD-10-CM

## 2017-06-26 DIAGNOSIS — R0602 Shortness of breath: Secondary | ICD-10-CM

## 2017-06-26 DIAGNOSIS — I429 Cardiomyopathy, unspecified: Secondary | ICD-10-CM

## 2017-06-26 DIAGNOSIS — R52 Pain, unspecified: Secondary | ICD-10-CM

## 2017-06-26 DIAGNOSIS — I10 Essential (primary) hypertension: Secondary | ICD-10-CM

## 2017-06-26 DIAGNOSIS — I509 Heart failure, unspecified: Secondary | ICD-10-CM | POA: Diagnosis not present

## 2017-07-09 ENCOUNTER — Other Ambulatory Visit: Payer: Self-pay | Admitting: Family

## 2017-07-12 DIAGNOSIS — H04123 Dry eye syndrome of bilateral lacrimal glands: Secondary | ICD-10-CM | POA: Diagnosis not present

## 2017-07-12 DIAGNOSIS — E119 Type 2 diabetes mellitus without complications: Secondary | ICD-10-CM | POA: Diagnosis not present

## 2017-07-12 DIAGNOSIS — H11153 Pinguecula, bilateral: Secondary | ICD-10-CM | POA: Diagnosis not present

## 2017-07-12 DIAGNOSIS — H31002 Unspecified chorioretinal scars, left eye: Secondary | ICD-10-CM | POA: Diagnosis not present

## 2017-07-23 ENCOUNTER — Other Ambulatory Visit: Payer: Self-pay | Admitting: Family Medicine

## 2017-07-23 NOTE — Progress Notes (Deleted)
Cardiology Office Note   Date:  07/23/2017   ID:  Curtis Chan, DOB 1925-09-21, MRN [id number]  PCP:  Sharion Balloon, FNP  Cardiologist:   Jenkins Rouge, MD   No chief complaint on file.     History of Present Illness: Curtis Chan is a 84 y.o. male who presents for post hospital f/u Seen in consult 06/04/17 Admitted with dyspnea and fatigue Noted to be in rapid flutter. Echo with EF 20% AS/AR low gradient low output. CHA2DS2 6 started on low dose eliquis given Age and CRF. Baseline CR is around 2. Note he is DNR and has hospice going to house 2x/week. DC 06/08/18 with coreg, hydralazine , nitrates and eliquis. Also history of DM and RICA stenosis 50-69% by duplex 11/07/16  ***    Past Medical History:  Diagnosis Date  . Cataract   . CKD (chronic kidney disease) stage 3, GFR 30-59 ml/min (HCC)   . Essential hypertension   . History of stroke 2011  . Type 2 diabetes mellitus ([place])     Past Surgical History:  Procedure Laterality Date  . CATARACT EXTRACTION W/PHACO Left 04/28/2013   Procedure: CATARACT EXTRACTION PHACO AND INTRAOCULAR LENS PLACEMENT LEFT EYE;  Surgeon: Tonny Branch, MD;  Location: [facility];  Service: Ophthalmology;  Laterality: Left;  CDE: 15.99.  06/25/13 caratract and lens placement in right eye  . CATARACT EXTRACTION W/PHACO Right 06/26/2013   Procedure: CATARACT EXTRACTION PHACO AND INTRAOCULAR LENS PLACEMENT ([facility]) CDE 13.44;  Surgeon: Tonny Branch, MD;  Location: [facility];  Service: Ophthalmology;  Laterality: Right;  . ORCHIECTOMY     unilateral, traumatic  . SURGERY OF LIP       Current Outpatient Medications  Medication Sig Dispense Refill  . atorvastatin (LIPITOR) 40 MG tablet TAKE (1) TABLET BY MOUTH AT BEDTIME. 15 tablet 0  . carvedilol (COREG) 6.25 MG tablet TAKE (1) TABLET BY MOUTH TWICE DAILY. 30 tablet 0  . ELIQUIS 2.5 MG TABS tablet TAKE (1) TABLET BY MOUTH TWICE DAILY. 30 tablet 0  . feeding supplement, ENSURE ENLIVE, (ENSURE ENLIVE) LIQD  Take 237 mLs by mouth 2 (two) times daily between meals. 237 mL 12  . glucose blood (FREESTYLE LITE) test strip Use to check blood glucose twice a day.  Dx:  250.03 100 each 5  . hydrALAZINE (APRESOLINE) 10 MG tablet Take 1 tablet (10 mg total) by mouth every 8 (eight) hours. 90 tablet 0  . isosorbide mononitrate (IMDUR) 30 MG 24 hr tablet Take 1 tablet (30 mg total) by mouth daily. 30 tablet 0  . isosorbide mononitrate (IMDUR) 30 MG 24 hr tablet TAKE 1 TABLET BY MOUTH ONCE A DAY. 15 tablet 0  . ULTICARE MINI PEN NEEDLES 31G X 6 MM MISC USE WITH INSULIN PEN ONCE A DAY. 100 each 2   No current facility-administered medications for this visit.     Allergies:   Patient has no known allergies.    Social History:  The patient  reports that he quit smoking about 34 years ago. His smoking use included cigarettes. He has a 8.75 pack-year smoking history. he has never used smokeless tobacco. He reports that he does not drink alcohol or use drugs.   Family History:  The patient's family history includes Cancer in his father; Diabetes in his brother, brother, brother, brother, sister, and sister.    ROS:  Please see the history of present illness.   Otherwise, review of systems are positive for none.  All other systems are reviewed and negative.    PHYSICAL EXAM: VS:  There were no vitals taken for this visit. , BMI There is no height or weight on file to calculate BMI. Affect appropriate Chronically ill black male  HEENT: normal Neck supple with no adenopathy JVP normal no bruits no thyromegaly Lungs clear with no wheezing and good diaphragmatic motion Heart:  S1/S2 AS/AR  murmur, no rub, gallop or click PMI normal Abdomen: benighn, BS positve, no tenderness, no AAA no bruit.  No HSM or HJR Distal pulses intact with no bruits No edema Neuro non-focal Skin warm and dry No muscular weakness    EKG:  06/05/18 SR rate 88 LVH    Recent Labs: 04/05/2017: ALT 9 06/04/2017: B Natriuretic  Peptide 1,263.0; TSH 2.517 06/15/2017: BUN 23; Creatinine, Ser 1.93; Hemoglobin 12.2; Platelets 152; Potassium 4.6; Sodium 142    Lipid Panel    Component Value Date/Time   CHOL 154 06/05/2017 0307   CHOL 189 04/05/2017 0950   TRIG 33 06/05/2017 0307   HDL 40 (L) 06/05/2017 0307   HDL 46 04/05/2017 0950   CHOLHDL 3.9 06/05/2017 0307   VLDL 7 06/05/2017 0307   LDLCALC 107 (H) 06/05/2017 0307   LDLCALC 128 (H) 04/05/2017 0950      Wt Readings from Last 3 Encounters:  06/15/17 193 lb 3.2 oz (87.6 kg)  06/08/17 196 lb 10.8 oz (89.2 kg)  06/04/17 192 lb 6.4 oz (87.3 kg)      Other studies Reviewed: Additional studies/ records that were reviewed today include: Rounding notes Dr Domenic Polite , ECG;s labs, echo  From recent hospitalization and d/c summary .    ASSESSMENT AND PLAN:  1.  PAF:  On low dose eliquis and coreg *** 2. DCM:  No chest pain or acute ECG changes troponin peak 06/05/18 8.69 ? Demand ischemia from rapid flutter *** 3. Carotid:  50-69% RICA by duplex May 2018 4. DM:  Discussed low carb diet.  Target hemoglobin A1c is 6.5 or less.  Continue current medications. 5. Chol:  On statin    Current medicines are reviewed at length with the patient today.  The patient does not have concerns regarding medicines.  The following changes have been made:  ***  Labs/ tests ordered today include: *** No orders of the defined types were placed in this encounter.    Disposition:   FU with ***     Signed, Jenkins Rouge, MD  07/23/2017 12:45 PM    [facility] [address], [place], [place]  [address] Phone: [phone]; Fax: [phone]

## 2017-07-30 ENCOUNTER — Ambulatory Visit: Payer: Medicare Other | Admitting: Cardiovascular Disease

## 2017-08-02 ENCOUNTER — Encounter: Payer: Self-pay | Admitting: Pediatrics

## 2017-08-02 ENCOUNTER — Ambulatory Visit (INDEPENDENT_AMBULATORY_CARE_PROVIDER_SITE_OTHER): Payer: Medicare Other | Admitting: Pediatrics

## 2017-08-02 VITALS — BP 134/71 | HR 71 | Temp 96.7°F | Ht 69.0 in | Wt 195.6 lb

## 2017-08-02 DIAGNOSIS — R0602 Shortness of breath: Secondary | ICD-10-CM

## 2017-08-02 DIAGNOSIS — E1122 Type 2 diabetes mellitus with diabetic chronic kidney disease: Secondary | ICD-10-CM | POA: Diagnosis not present

## 2017-08-02 DIAGNOSIS — I255 Ischemic cardiomyopathy: Secondary | ICD-10-CM

## 2017-08-02 DIAGNOSIS — N183 Chronic kidney disease, stage 3 (moderate): Secondary | ICD-10-CM | POA: Diagnosis not present

## 2017-08-02 DIAGNOSIS — I429 Cardiomyopathy, unspecified: Secondary | ICD-10-CM | POA: Diagnosis not present

## 2017-08-02 DIAGNOSIS — I4891 Unspecified atrial fibrillation: Secondary | ICD-10-CM | POA: Diagnosis not present

## 2017-08-02 DIAGNOSIS — E118 Type 2 diabetes mellitus with unspecified complications: Secondary | ICD-10-CM

## 2017-08-02 DIAGNOSIS — I5021 Acute systolic (congestive) heart failure: Secondary | ICD-10-CM

## 2017-08-02 DIAGNOSIS — I10 Essential (primary) hypertension: Secondary | ICD-10-CM | POA: Diagnosis not present

## 2017-08-02 MED ORDER — HYDRALAZINE HCL 10 MG PO TABS: 10.0000 mg | ORAL_TABLET | Freq: Two times a day (BID) | ORAL | 3 refills | Status: DC

## 2017-08-02 NOTE — Progress Notes (Signed)
  Subjective:   Patient ID: Curtis Chan, male    DOB: 1926/01/13, 84 y.o.   MRN: [id number] CC: Follow-up and Extremity Weakness  HPI: MONTERRIO GERST is a 84 y.o. male presenting for Follow-up and Extremity Weakness  Today feeling more tired than usual. Here today with his daughter. He asks for any medications that can be taken away be removed. He is followed by hospice for systolic CHF. He is on eliquis for Afib. He has HTN, has been taking his medicines regularly. Hospice nurse coming by once a week now, was twice a week but pt requested to go to once. Lives alone with family nearby. No heart palpitations. No URI symptoms, no coughing. No swelling in LE. Appetite has been fine including today. No pain anywhere. No fevers.   Felt well yesterday, slightly more weak today. Walking without assist device today. No falls at home. He has night sweats when he takes hydralazine 10mg  third dose before bed. He has accidentally skipped that pill a few nights and does not have the night sweats when he doesn't take the last pill. Hospice nurse checking his blood sugars per pt and daughter he is now off of insulin.  Relevant past medical, surgical, family and social history reviewed. Allergies and medications reviewed and updated. Social History   Tobacco Use  Smoking Status Former Smoker  . Packs/day: 0.25  . Years: 35.00  . Pack years: 8.75  . Types: Cigarettes  . Last attempt to quit: 04/25/1983  . Years since quitting: 34.2  Smokeless Tobacco Never Used   ROS: Per HPI   Objective:    BP 134/71   Pulse 71   Temp (!) 96.7 F (35.9 C) (Oral)   Ht 5\' 9"  (1.753 m)   Wt 195 lb 9.6 oz (88.7 kg)   BMI 28.89 kg/m   Wt Readings from Last 3 Encounters:  08/02/17 195 lb 9.6 oz (88.7 kg)  06/15/17 193 lb 3.2 oz (87.6 kg)  06/08/17 196 lb 10.8 oz (89.2 kg)    Gen: NAD, alert, cooperative with exam, NCAT EYES: EOMI, no conjunctival injection, or no icterus ENT:  TMs pearly gray b/l, OP without  erythema LYMPH: no cervical LAD CV: NRRR, normal S1/S2 Resp: CTABL, no wheezes, normal WOB Abd: +BS, soft, NTND.  Ext: No edema, warm Neuro: Alert and oriented   Assessment & Plan:  Rich was seen today for follow-up multiple med problems. Overall feeling well. Would like any medications stopped that he can because of pill burden. Will dc atorvastatin.   Diagnoses and all orders for this visit:  Essential hypertension Decrease to two doses a day, ok to skip evening dose as when he takes it he has night sweats. If symptoms not improving let me know -     hydrALAZINE (APRESOLINE) 10 MG tablet; Take 1 tablet (10 mg total) by mouth 2 (two) times daily.  Type 2 diabetes mellitus with stage 3 chronic kidney disease, without long-term current use of insulin (HCC) Stable, not on insulin now. Hospice nurse check BGLs  Atrial fibrillation, unspecified type ([place]) Cont eliquis  Ischemic cardiomyopathy systolic CHF (congestive heart failure) ([place]) Now on hospice. OK to take lasix three days a week, was having dry mouth. If swelling worsens go back to daily.  Follow up plan: Return in about 3 months (around 10/30/2017). Assunta Found, MD [place]

## 2017-08-02 NOTE — Patient Instructions (Signed)
Call me with blood pressures in 2-3 weeks

## 2017-08-28 ENCOUNTER — Encounter: Payer: Self-pay | Admitting: Pediatrics

## 2017-08-28 ENCOUNTER — Ambulatory Visit (INDEPENDENT_AMBULATORY_CARE_PROVIDER_SITE_OTHER): Payer: Medicare Other | Admitting: Pediatrics

## 2017-08-28 VITALS — BP 139/77 | HR 84 | Temp 97.0°F | Ht 69.0 in | Wt 195.4 lb

## 2017-08-28 DIAGNOSIS — L089 Local infection of the skin and subcutaneous tissue, unspecified: Secondary | ICD-10-CM | POA: Diagnosis not present

## 2017-08-28 DIAGNOSIS — E119 Type 2 diabetes mellitus without complications: Secondary | ICD-10-CM | POA: Diagnosis not present

## 2017-08-28 LAB — GLUCOSE HEMOCUE WAIVED: GLU HEMOCUE WAIVED: 123 mg/dL — AB (ref 65–99)

## 2017-08-28 MED ORDER — CEPHALEXIN 250 MG PO CAPS: 250.0000 mg | ORAL_CAPSULE | Freq: Two times a day (BID) | ORAL | 0 refills | Status: DC

## 2017-08-28 NOTE — Progress Notes (Signed)
  Subjective:   Patient ID: Curtis Chan, male    DOB: Mar 14, 1926, 84 y.o.   MRN: [id number] CC: Edema (Bilateral legs)  HPI: Curtis Chan is a 84 y.o. male presenting for Edema (Bilateral legs)  Patient with history of systolic CHF, on hospice.  Here today with his grandson because of increased swelling in his right leg.  About 2 weeks ago he noticed it.  Followed by the hospice nurse.  Curtis Chan thinks it was worst about 2 days ago.  Has been getting better since then.  Leg sometimes feels warm.  Lower leg itself sometimes is slightly tender.  He used a pocket knife on the bottom of his foot in November 2018 and got an infection weeks ago.  He says it still feels somewhat sore.  The leg swelling is better in the morning, gets worse throughout the day the more he is on it.  He takes Eliquis twice a day for atrial fibrillation.  No fevers, appetite has been fine.  The other leg has not been swelling much.  Relevant past medical, surgical, family and social history reviewed. Allergies and medications reviewed and updated. Social History   Tobacco Use  Smoking Status Former Smoker  . Packs/day: 0.25  . Years: 35.00  . Pack years: 8.75  . Types: Cigarettes  . Last attempt to quit: 04/25/1983  . Years since quitting: 34.3  Smokeless Tobacco Never Used   ROS: Per HPI   Objective:    BP 139/77   Pulse 84   Temp (!) 97 F (36.1 C) (Oral)   Ht 5\' 9"  (1.753 m)   Wt 195 lb 6.4 oz (88.6 kg)   BMI 28.86 kg/m   Wt Readings from Last 3 Encounters:  08/28/17 195 lb 6.4 oz (88.6 kg)  08/02/17 195 lb 9.6 oz (88.7 kg)  06/15/17 193 lb 3.2 oz (87.6 kg)    Gen: NAD, alert, cooperative with exam, NCAT EYES: EOMI, no conjunctival injection, or no icterus FK:CLEXNT pulses 2+ b/l Resp:normal WOB Abd: +BS, soft, NTND. no guarding or organomegaly Ext: 1+ pitting edema right lower leg to the mid shin.  Left ankle with trace to 1+ pitting edema.  Neuro: Alert and oriented, strength equal b/l UE  and LE, coordination grossly normal Skin: 1 mm punctate wound lateral plantar surface right foot.  Right anterior shin with some erythema, minimal tenderness with palpation.  Assessment & Plan:  Curtis Chan was seen today for edema.  Already on Eliquis for atrial fibrillation.  Diagnoses and all orders for this visit:  Skin infection Given redness, tenderness, increased swelling right foot and lower leg we will treat with below.  Return precautions discussed. -     cephALEXin (KEFLEX) 250 MG capsule; Take 1 capsule (250 mg total) by mouth 2 (two) times daily.  Diabetes mellitus without complication (HCC) Random glucose 123.  Not currently on insulin. -     Glucose Hemocue Waived   Follow up plan: 2 months as scheduled, sooner if needed. Assunta Found, MD [place]

## 2017-10-16 ENCOUNTER — Ambulatory Visit (INDEPENDENT_AMBULATORY_CARE_PROVIDER_SITE_OTHER): Payer: Medicare Other

## 2017-10-16 DIAGNOSIS — I429 Cardiomyopathy, unspecified: Secondary | ICD-10-CM

## 2017-10-16 DIAGNOSIS — E118 Type 2 diabetes mellitus with unspecified complications: Secondary | ICD-10-CM | POA: Diagnosis not present

## 2017-10-16 DIAGNOSIS — I509 Heart failure, unspecified: Secondary | ICD-10-CM

## 2017-10-16 DIAGNOSIS — I10 Essential (primary) hypertension: Secondary | ICD-10-CM

## 2017-10-16 DIAGNOSIS — R531 Weakness: Secondary | ICD-10-CM

## 2017-10-16 DIAGNOSIS — N189 Chronic kidney disease, unspecified: Secondary | ICD-10-CM | POA: Diagnosis not present

## 2017-10-16 DIAGNOSIS — R0602 Shortness of breath: Secondary | ICD-10-CM

## 2017-10-16 DIAGNOSIS — R52 Pain, unspecified: Secondary | ICD-10-CM

## 2017-11-05 ENCOUNTER — Other Ambulatory Visit: Payer: Self-pay | Admitting: Family Medicine

## 2017-11-24 IMAGING — US US CAROTID DUPLEX BILAT
1 series · 13 of 24 positions shown · non-contrast
Comparison: None.

CLINICAL DATA: Ocular ischemia. History of hypertension, stroke,
CAD, peripheral vascular disease and diabetes. Former smoker.

EXAM:
BILATERAL CAROTID DUPLEX ULTRASOUND
TECHNIQUE: Gray scale imaging, color Doppler and duplex ultrasound were
performed of bilateral carotid and vertebral arteries in the neck.

[Series 1: us carotid duplex bilat · 0.09mm/px · 13 of 68 slices shown]
[im 1/68]
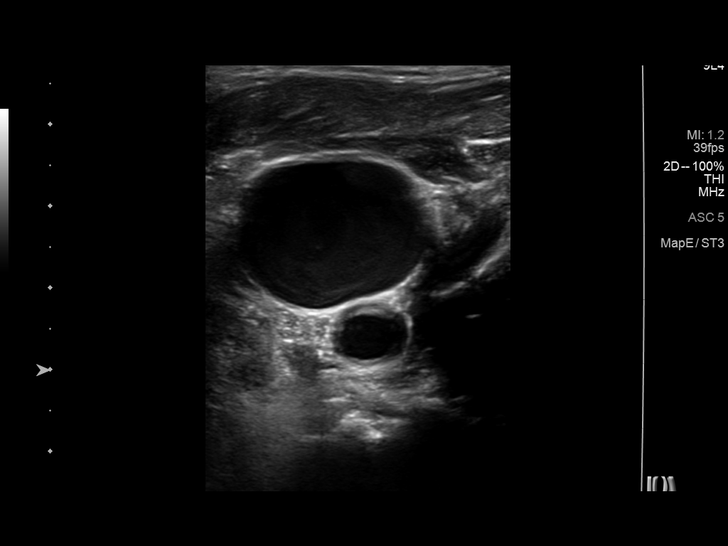
[im 6/68]
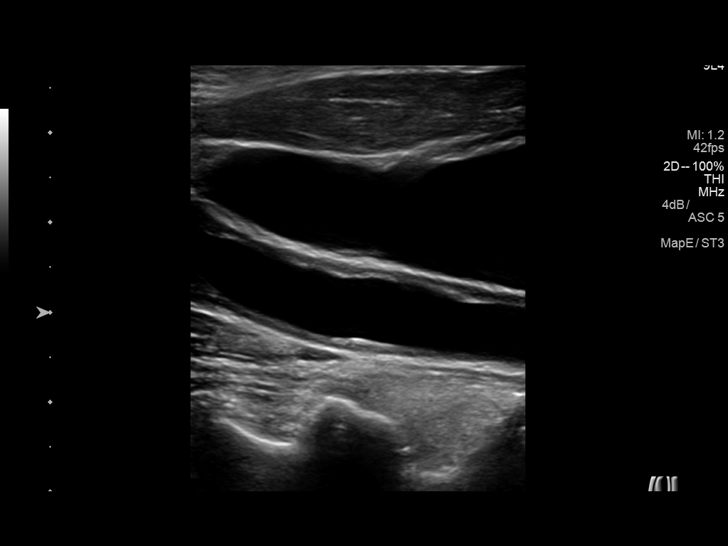
[im 12/68]
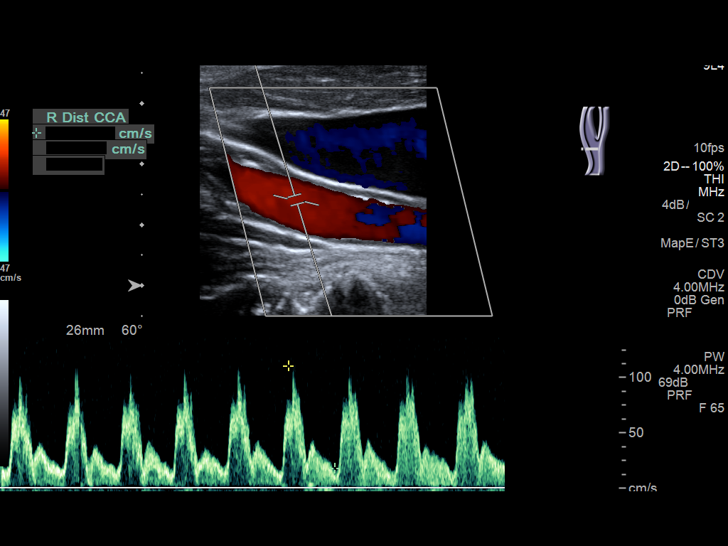
[im 18/68]
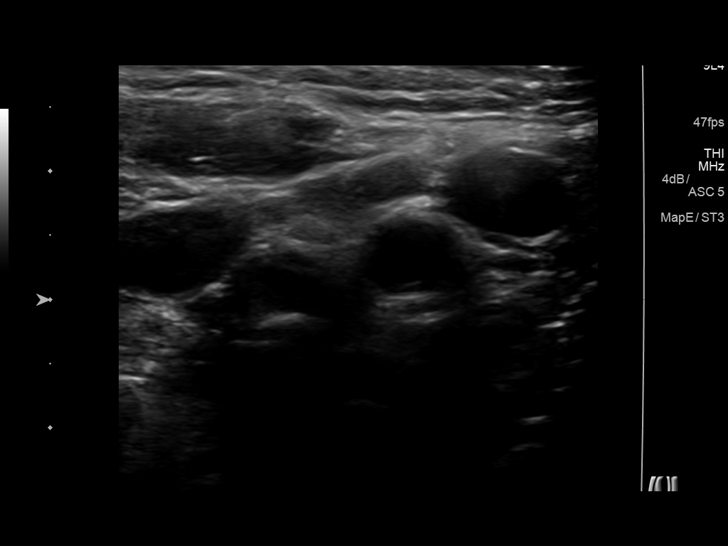
[im 24/68]
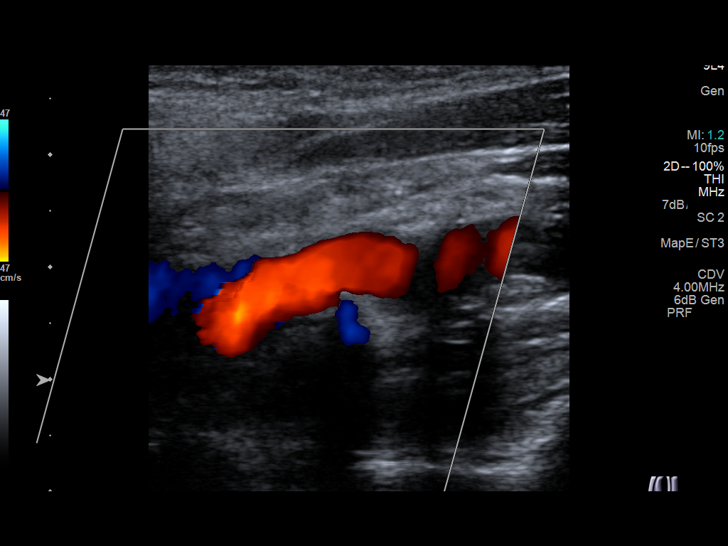
[im 30/68]
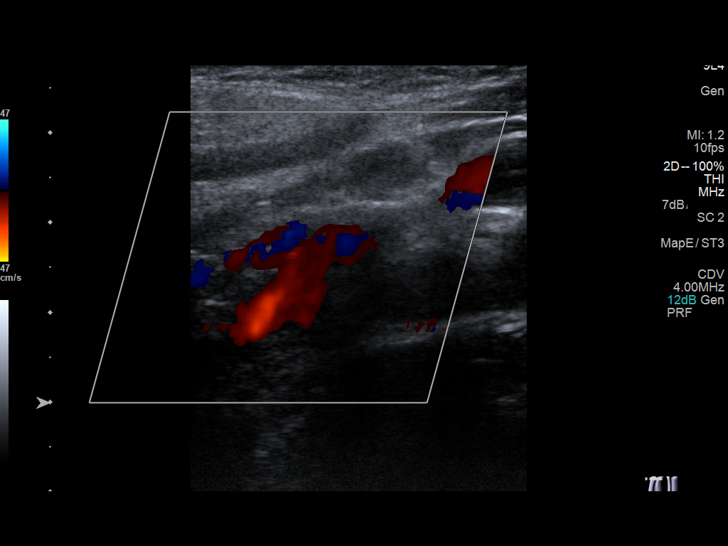
[im 35/68]
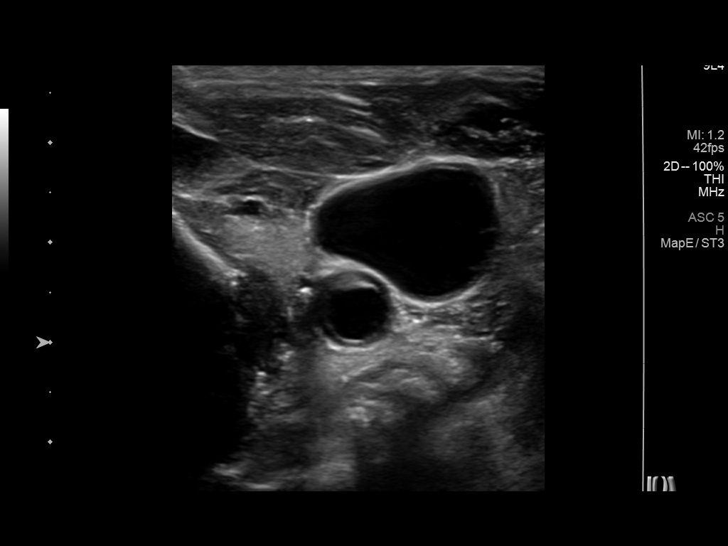
[im 38/68]
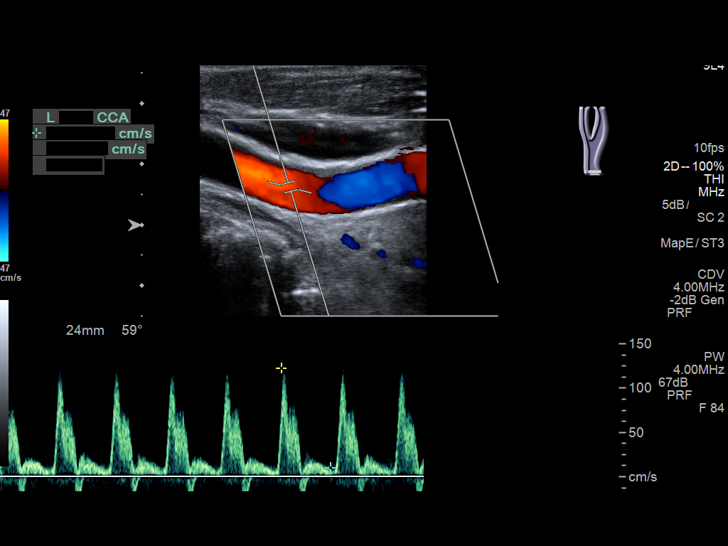
[im 44/68]
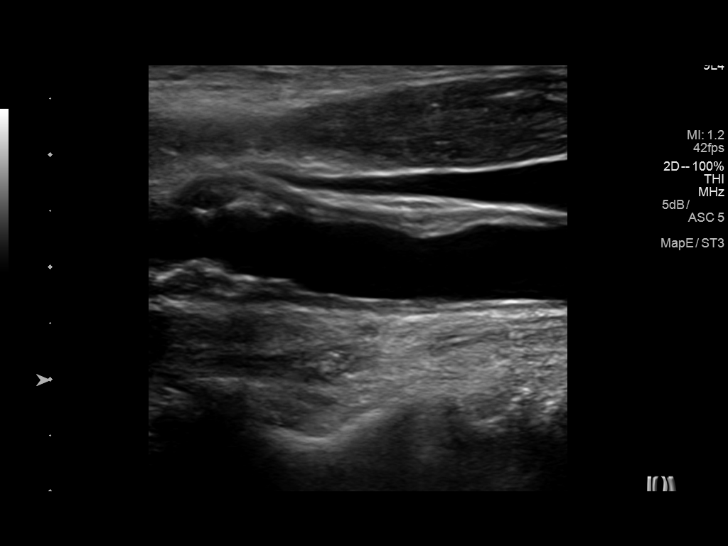
[im 50/68]
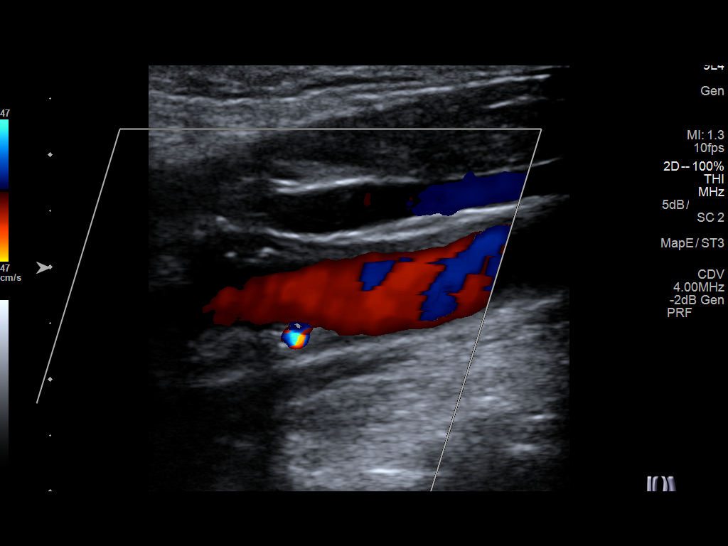
[im 56/68]
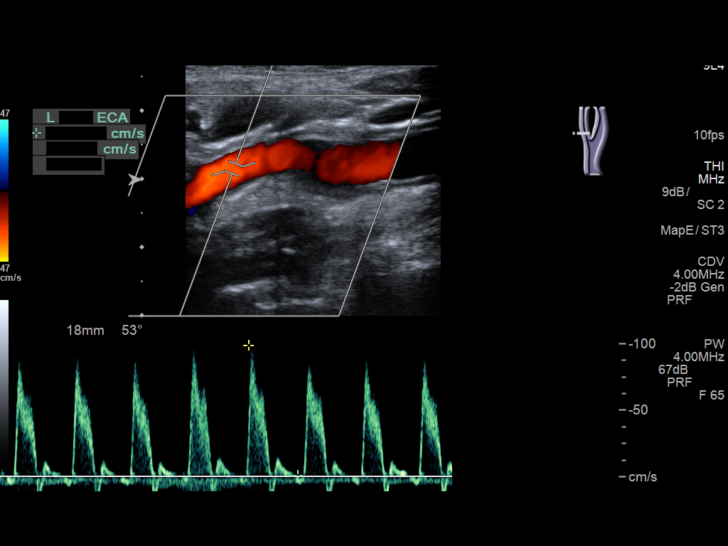
[im 62/68]
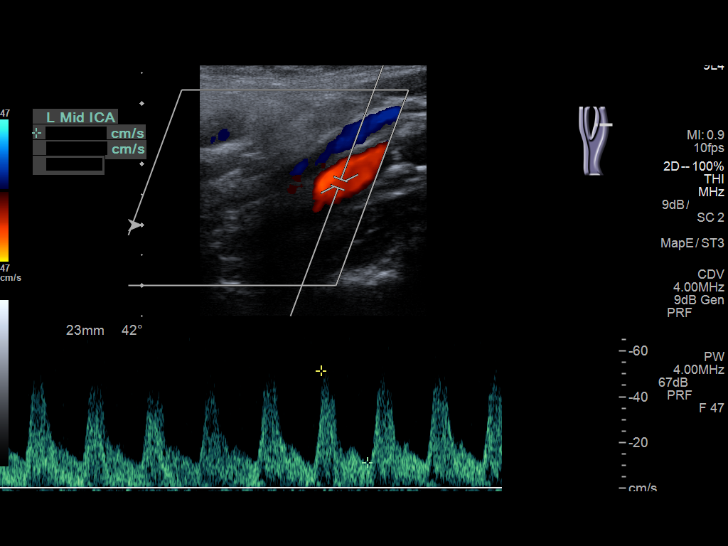
[im 68/68]
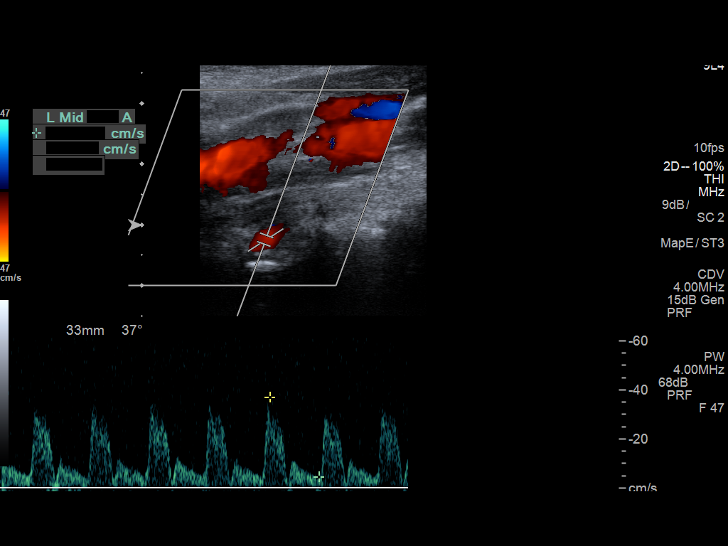

[13 of 24 positions shown; findings below may reference images not displayed]

FINDINGS: Criteria: Quantification of carotid stenosis is based on velocity
parameters that correlate the residual internal carotid diameter
with NASCET-based stenosis levels, using the diameter of the distal
internal carotid lumen as the denominator for stenosis measurement.

The following velocity measurements were obtained:

RIGHT

ICA:  131/42 cm/sec

CCA:  84/10 cm/sec

SYSTOLIC ICA/CCA RATIO:

DIASTOLIC ICA/CCA RATIO:

ECA:  119 cm/sec

LEFT

ICA:  59/17 cm/sec

CCA:  117/14 cm/sec

SYSTOLIC ICA/CCA RATIO:

DIASTOLIC ICA/CCA RATIO:

ECA:  99 cm/sec

RIGHT CAROTID ARTERY: There is a moderate amount of eccentric mixed
echogenic plaque within the right carotid bulb (images 14 and 16),
extending to involve the origin and proximal aspects of the right
internal carotid artery (image 24), resulting in elevated peak
systolic velocities within the proximal aspect of the right internal
carotid artery (greatest acquired peak systolic velocity with the
proximal ICA measures 131 cm/sec - image 26).

RIGHT VERTEBRAL ARTERY:  Antegrade Flow

LEFT CAROTID ARTERY: Intimal thickening throughout the left common
carotid artery. There is a minimal to moderate amount of eccentric
mixed echogenic plaque within the left carotid bulb (images 48 and
50), extending to involve the origin and proximal aspects of the
left internal carotid artery (image 58), not resulting in elevated
peak systolic velocities within the interrogated course the left
internal carotid artery to suggest a hemodynamically significant
stenosis.

LEFT VERTEBRAL ARTERY:  Antegrade Flow
IMPRESSION: 1. Moderate amount of right-sided atherosclerotic plaque results in
elevated peak systolic velocities with the right internal carotid
artery compatible with the 50-69% luminal narrowing range.
2. Minimal to moderate amount of left-sided atherosclerotic plaque,
not resulting in a hemodynamically significant stenosis.

## 2017-11-28 ENCOUNTER — Ambulatory Visit (INDEPENDENT_AMBULATORY_CARE_PROVIDER_SITE_OTHER): Payer: Medicare Other | Admitting: Family Medicine

## 2017-11-28 VITALS — BP 190/92 | HR 98 | Temp 96.8°F | Ht 69.0 in | Wt 199.8 lb

## 2017-11-28 DIAGNOSIS — E11621 Type 2 diabetes mellitus with foot ulcer: Secondary | ICD-10-CM | POA: Diagnosis not present

## 2017-11-28 DIAGNOSIS — L97519 Non-pressure chronic ulcer of other part of right foot with unspecified severity: Secondary | ICD-10-CM

## 2017-11-28 DIAGNOSIS — I10 Essential (primary) hypertension: Secondary | ICD-10-CM

## 2017-11-28 MED ORDER — ACYCLOVIR 200 MG/5ML PO SUSP: 400.0000 mg | Freq: Four times a day (QID) | ORAL | 0 refills | Status: DC

## 2017-11-28 NOTE — Progress Notes (Signed)
   HPI  Patient presents today foot ulcer as well as hypertension.  Patient is on hospice, he reports that he has not been taking his medication for a month or 2.  His daughter believes he had not been taking medication for a week or so.  Patient does not have any current symptoms except for intermittent soreness of the right foot over an ulcer.  Patient believes that his previous podiatrist was not taking care of the sore.  Patient explains that he stuck a knife in the callus about 8 months ago and has had a sore since that time.  Reports that 1 of his medications caused paralysis from the waist down several weeks ago causing him to stop the medication.  PMH: Smoking status noted ROS: Per HPI  Objective: BP (!) 190/92   Pulse 98   Temp (!) 96.8 F (36 C) (Oral)   Ht 5\' 9"  (1.753 m)   Wt 199 lb 12.8 oz (90.6 kg)   BMI 29.51 kg/m  Gen: NAD, alert, cooperative with exam HEENT: NCAT CV: RRR, good S1/S2, no murmur Resp: CTABL, no wheezes, non-labored Ext: No edema, warm Neuro: Alert and oriented, No gross deficits   Right forefoot on the lateral side with thick callus approximately 1 cm in diameter with a small opening and clear serous fluid expressed with aggressive palpation  Assessment and plan:  #Diabetic foot ulcer Refer to podiatry, he would like to change podiatrist No signs of active infection Emphasized how important this is  #Hypertension Severe range hypertension today, patient has simply stop medications, he will restart one medication at a time with his daughters help beginning with amlodipine today.  Also discussed that if the patient refuses to take medication we simply cannot make him take medication.  He may be making choices that could result in thedetrimental consequences, this was all discussed with the patient.   Orders Placed This Encounter  Procedures  . Ambulatory referral to Podiatry    Referral Priority:   Routine    Referral Type:    Consultation    Referral Reason:   Specialty Services Required    Requested Specialty:   Podiatry    Number of Visits Requested:   [place], MD [facility] 11/28/2017, 8:41 AM

## 2017-11-28 NOTE — Patient Instructions (Signed)
Great to see you!  I have referred him to podiatry  Start back on his medications 1 pill at a time every 2-3 days, start with amlodipine.

## 2017-12-10 ENCOUNTER — Telehealth: Payer: Self-pay | Admitting: Pediatrics

## 2017-12-12 ENCOUNTER — Ambulatory Visit: Payer: Medicare Other | Admitting: Pediatrics

## 2017-12-13 ENCOUNTER — Other Ambulatory Visit: Payer: Self-pay | Admitting: Family

## 2017-12-26 ENCOUNTER — Other Ambulatory Visit: Payer: Self-pay | Admitting: Family Medicine

## 2017-12-26 NOTE — Progress Notes (Signed)
Per referrals coordinator in reference to podiatry  FYI Patient no showed first scheduled appt. And then rescheduled and cancelled   Laroy Apple, MD [facility] 12/26/2017, 7:45 AM

## 2017-12-28 ENCOUNTER — Encounter: Payer: Self-pay | Admitting: Pediatrics

## 2017-12-28 ENCOUNTER — Ambulatory Visit (INDEPENDENT_AMBULATORY_CARE_PROVIDER_SITE_OTHER): Payer: Medicare Other | Admitting: Pediatrics

## 2017-12-28 VITALS — BP 168/77 | HR 84 | Temp 97.4°F | Ht 69.0 in | Wt 194.4 lb

## 2017-12-28 DIAGNOSIS — E1122 Type 2 diabetes mellitus with diabetic chronic kidney disease: Secondary | ICD-10-CM

## 2017-12-28 DIAGNOSIS — L84 Corns and callosities: Secondary | ICD-10-CM | POA: Diagnosis not present

## 2017-12-28 DIAGNOSIS — N183 Chronic kidney disease, stage 3 unspecified: Secondary | ICD-10-CM

## 2017-12-28 DIAGNOSIS — I10 Essential (primary) hypertension: Secondary | ICD-10-CM

## 2017-12-28 LAB — BAYER DCA HB A1C WAIVED: HB A1C: 6.2 % (ref <7.0)

## 2017-12-28 NOTE — Progress Notes (Signed)
  Subjective:   Patient ID: Curtis Chan, male    DOB: 09-14-1925, 84 y.o.   MRN: [id number] CC: Medical Management of Chronic Issues  HPI: Curtis Chan is a 84 y.o. male   Here today with his daughter.  He is on hospice for systolic CHF.  A couple months ago he stopped all of his medicines but now is back to taking imdur daily and hydralazine twice a day.  He says he feels well, is enjoying his quality of life, feels like he has good energy.  The hospice nurse comes by once a week.  He does not want to take Eliquis anymore, he is aware of the risk of stroke.  He said it made him feel cold all the time, he feels better since he stopped it.  No headaches, no chest pain.  No swelling in his ankles or feet.  He does not want to take any fluid pills anymore, caused too much urination.  He has a callus on his right foot.  It bothers him some when he walks on it.  No discharge, ulceration.  Relevant past medical, surgical, family and social history reviewed. Allergies and medications reviewed and updated. Social History   Tobacco Use  Smoking Status Former Smoker  . Packs/day: 0.25  . Years: 35.00  . Pack years: 8.75  . Types: Cigarettes  . Last attempt to quit: 04/25/1983  . Years since quitting: 34.7  Smokeless Tobacco Never Used   ROS: Per HPI   Objective:    BP (!) 168/77   Pulse 84   Temp (!) 97.4 F (36.3 C) (Oral)   Ht 5\' 9"  (1.753 m)   Wt 194 lb 6.4 oz (88.2 kg)   BMI 28.71 kg/m   Wt Readings from Last 3 Encounters:  12/28/17 194 lb 6.4 oz (88.2 kg)  11/28/17 199 lb 12.8 oz (90.6 kg)  08/28/17 195 lb 6.4 oz (88.6 kg)    Gen: NAD, alert, cooperative with exam, NCAT EYES: EOMI, no conjunctival injection, or no icterus CV: NRRR, normal S1/S2 Resp: CTABL, no wheezes, normal WOB Abd: +BS, soft, NTND. no guarding or organomegaly Ext: No edema, warm Neuro: Alert and oriented Skin: Right foot fifth MTP joint, palmar surface with approximately 1 cm of thickened skin.  No  redness, tenderness.  No discharge.  Assessment & Plan:  Teran was seen today for medical management of chronic issues.  Diagnoses and all orders for this visit:  Hypertension Elevated today.  Patient feeling well.  He wants to minimize medications he has to take as much as possible, currently on hospice for systolic CHF.  Continue Imdur.  Stop hydralazine which is multiple times a day.  Restart amlodipine which he has been on in the past.  He is continuing to be followed by hospice, they check his blood pressure and blood sugars at weekly visits.  Type 2 diabetes mellitus with stage 3 chronic kidney disease, without long-term current use of insulin ([place]) Diet controlled, blood work in future only as needed. -     Bayer DCA Hb A1c Waived  Callus of foot Does not appear infected now.  Offered referral to podiatry.  Encourage patient not to Dr. on it at home.  Patient says he does not want to see anybody right now, but would take the number just in case it starts to bother him more. Information given.  Follow up plan: 3 months, sooner if needed Assunta Found, MD [place]

## 2017-12-28 NOTE — Patient Instructions (Signed)
[facility] Address: [address] [place], [place] [address] Phone: [phone]

## 2017-12-29 MED ORDER — AMLODIPINE BESYLATE 10 MG PO TABS: 10.0000 mg | ORAL_TABLET | Freq: Every day | ORAL | 1 refills | Status: AC

## 2018-01-04 IMAGING — CR DG CHEST 1V PORT
1 series · 1 of 1 positions shown · non-contrast
Comparison: None.

CLINICAL DATA: Acute shortness of breath

EXAM:
PORTABLE CHEST 1 VIEW

[portable]
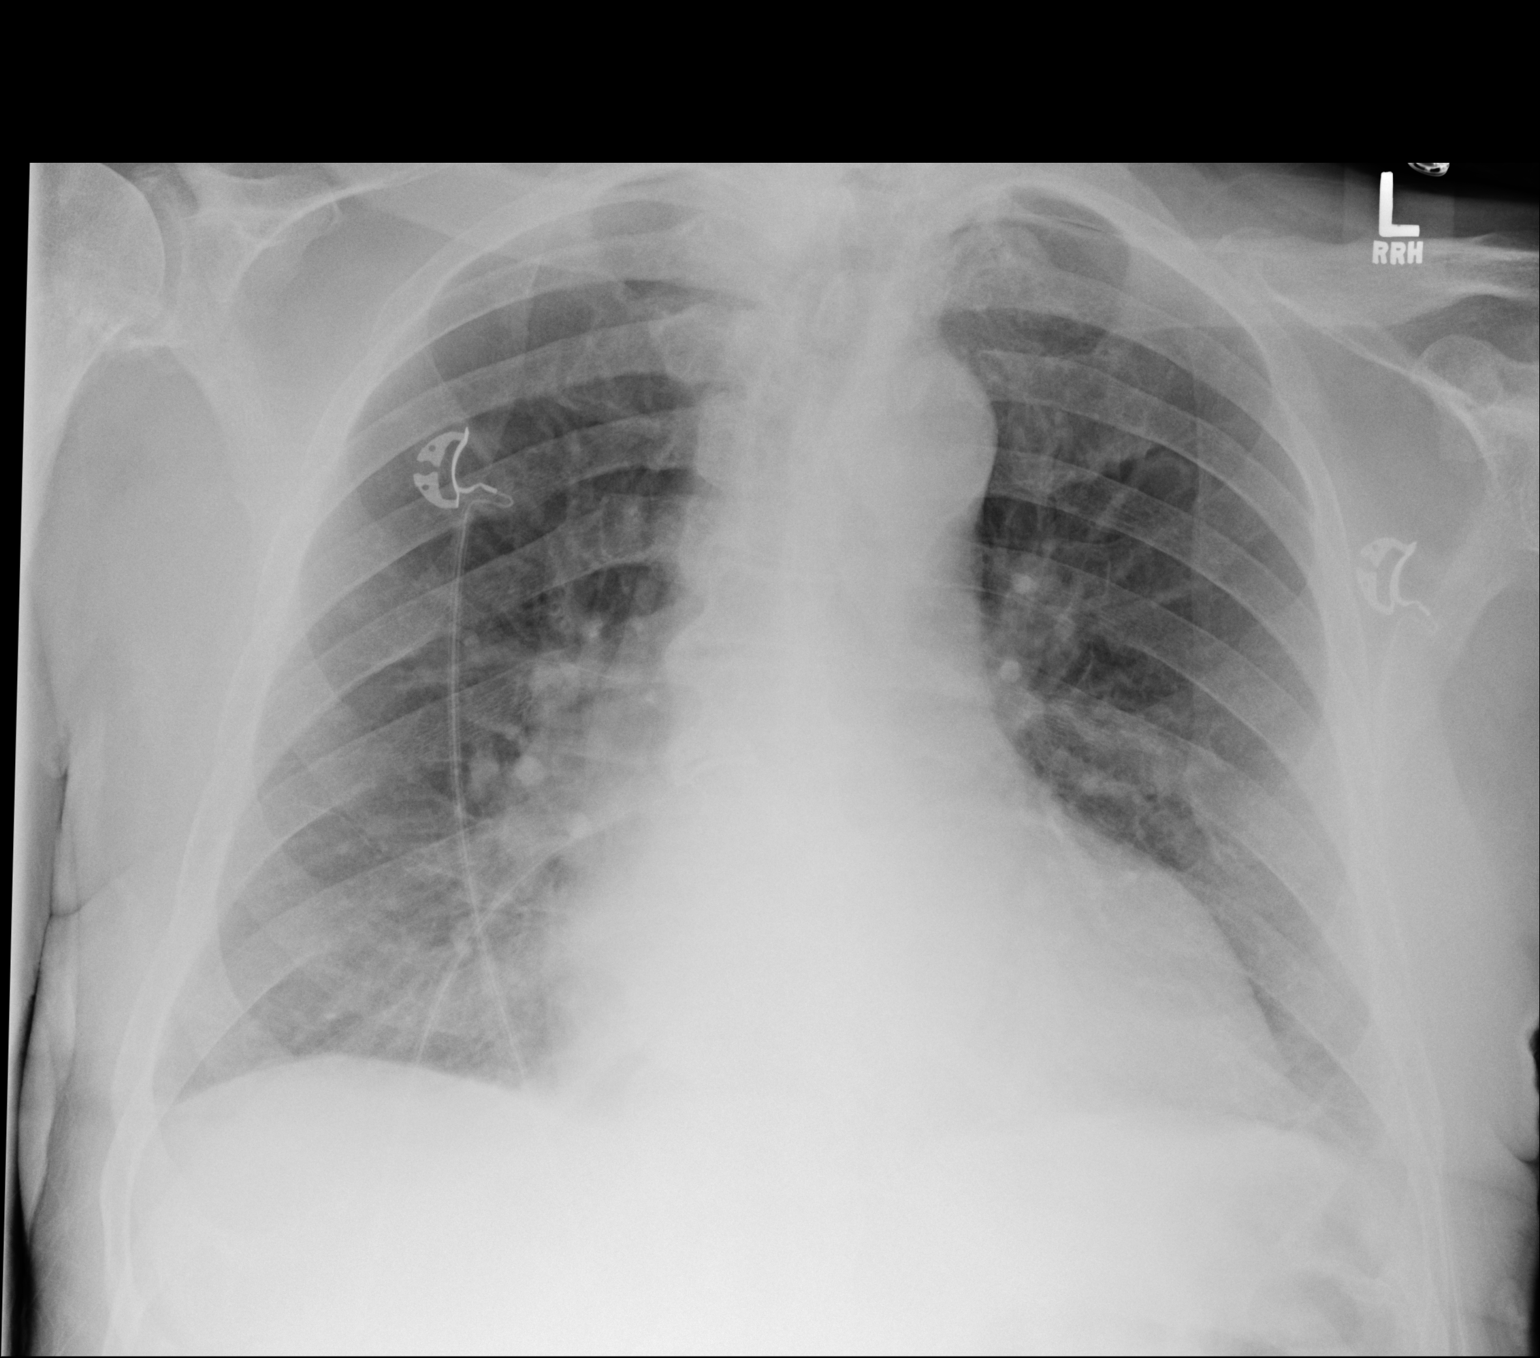

[1 of 1 positions shown; findings below may reference images not displayed]

FINDINGS: Cardiomegaly and mild pulmonary vascular congestion noted.

There is no evidence of focal airspace disease, pulmonary edema,
suspicious pulmonary nodule/mass, pleural effusion, or pneumothorax.

No acute bony abnormalities are identified.
IMPRESSION: Cardiomegaly with mild pulmonary vascular congestion.

## 2018-01-07 ENCOUNTER — Telehealth: Payer: Self-pay | Admitting: Pediatrics

## 2018-01-07 NOTE — Telephone Encounter (Signed)
Also they are wanting [place] to write an prescription for BP monitor so patient can check at home

## 2018-01-07 NOTE — Telephone Encounter (Signed)
LMTCB

## 2018-01-23 ENCOUNTER — Other Ambulatory Visit: Payer: Self-pay | Admitting: Family

## 2018-04-15 ENCOUNTER — Ambulatory Visit (INDEPENDENT_AMBULATORY_CARE_PROVIDER_SITE_OTHER): Payer: Medicare Other

## 2018-04-15 DIAGNOSIS — Z23 Encounter for immunization: Secondary | ICD-10-CM | POA: Diagnosis not present

## 2018-04-18 ENCOUNTER — Telehealth: Payer: Self-pay | Admitting: Family Medicine

## 2018-04-18 ENCOUNTER — Ambulatory Visit (INDEPENDENT_AMBULATORY_CARE_PROVIDER_SITE_OTHER): Payer: Medicare Other

## 2018-04-18 DIAGNOSIS — N189 Chronic kidney disease, unspecified: Secondary | ICD-10-CM

## 2018-04-18 DIAGNOSIS — I429 Cardiomyopathy, unspecified: Secondary | ICD-10-CM

## 2018-04-18 DIAGNOSIS — I10 Essential (primary) hypertension: Secondary | ICD-10-CM

## 2018-04-18 DIAGNOSIS — R52 Pain, unspecified: Secondary | ICD-10-CM

## 2018-04-18 DIAGNOSIS — R531 Weakness: Secondary | ICD-10-CM

## 2018-04-18 DIAGNOSIS — E118 Type 2 diabetes mellitus with unspecified complications: Secondary | ICD-10-CM

## 2018-04-18 DIAGNOSIS — R0602 Shortness of breath: Secondary | ICD-10-CM

## 2018-04-18 DIAGNOSIS — I509 Heart failure, unspecified: Secondary | ICD-10-CM | POA: Diagnosis not present

## 2018-04-19 DEATH — deceased
